# Patient Record
Sex: Female | Born: 1965
Health system: Southern US, Community
[De-identification: ages and names within clinical notes are randomized; demographics above are authoritative.]

---

## 2010-01-21 ENCOUNTER — Observation Stay (HOSPITAL_COMMUNITY): Admission: EM | Admit: 2010-01-21 | Discharge: 2010-01-22 | Payer: Self-pay | Admitting: Emergency Medicine

## 2010-04-08 ENCOUNTER — Emergency Department (HOSPITAL_COMMUNITY)
Admission: EM | Admit: 2010-04-08 | Discharge: 2010-04-09 | Payer: Self-pay | Source: Home / Self Care | Admitting: Emergency Medicine

## 2010-04-10 LAB — URINALYSIS, ROUTINE W REFLEX MICROSCOPIC
Bilirubin Urine: NEGATIVE
Ketones, ur: >80 mg/dL — AB
Leukocytes, UA: NEGATIVE
Nitrite: NEGATIVE
Protein, ur: NEGATIVE mg/dL
Specific Gravity, Urine: 1.038 — ABNORMAL HIGH (ref 1.005–1.030)
Urine Glucose, Fasting: >1000 mg/dL — AB
Urobilinogen, UA: 0.2 mg/dL (ref 0.0–1.0)
pH: 6.5 (ref 5.0–8.0)

## 2010-04-10 LAB — URINE MICROSCOPIC-ADD ON

## 2010-04-10 LAB — GLUCOSE, CAPILLARY: Glucose-Capillary: 227 mg/dL — ABNORMAL HIGH (ref 70–99)

## 2010-04-10 LAB — POCT PREGNANCY, URINE: Preg Test, Ur: NEGATIVE

## 2010-05-28 LAB — URINE MICROSCOPIC-ADD ON

## 2010-05-28 LAB — COMPREHENSIVE METABOLIC PANEL
ALT: 13 U/L (ref 0–35)
AST: 17 U/L (ref 0–37)
Albumin: 4.3 g/dL (ref 3.5–5.2)
Alkaline Phosphatase: 45 U/L (ref 39–117)
BUN: 5 mg/dL — ABNORMAL LOW (ref 6–23)
CO2: 24 mEq/L (ref 19–32)
Calcium: 9.5 mg/dL (ref 8.4–10.5)
Chloride: 105 mEq/L (ref 96–112)
Creatinine, Ser: 0.61 mg/dL (ref 0.4–1.2)
GFR calc Af Amer: >60 mL/min (ref >60)
GFR calc non Af Amer: >60 mL/min (ref >60)
Glucose, Bld: 232 mg/dL — ABNORMAL HIGH (ref 70–99)
Potassium: 3.1 mEq/L — ABNORMAL LOW (ref 3.5–5.1)
Sodium: 140 mEq/L (ref 135–145)
Total Bilirubin: 0.8 mg/dL (ref 0.3–1.2)
Total Protein: 8.3 g/dL (ref 6.0–8.3)

## 2010-05-28 LAB — URINE CULTURE
Colony Count: 10000
Culture  Setup Time: 201111072242

## 2010-05-28 LAB — CBC
HCT: 37.1 % (ref 36.0–46.0)
Hemoglobin: 12.6 g/dL (ref 12.0–15.0)
MCH: 32.5 pg (ref 26.0–34.0)
MCHC: 34 g/dL (ref 30.0–36.0)
MCV: 95.6 fL (ref 78.0–100.0)
Platelets: 209 10*3/uL (ref 150–400)
RBC: 3.88 MIL/uL (ref 3.87–5.11)
RDW: 13 % (ref 11.5–15.5)
WBC: 9.8 10*3/uL (ref 4.0–10.5)

## 2010-05-28 LAB — URINALYSIS, ROUTINE W REFLEX MICROSCOPIC
Glucose, UA: >1000 mg/dL — AB
Hgb urine dipstick: NEGATIVE
Ketones, ur: >80 mg/dL — AB
Leukocytes, UA: NEGATIVE
Nitrite: NEGATIVE
Protein, ur: 30 mg/dL — AB
Specific Gravity, Urine: 1.034 — ABNORMAL HIGH (ref 1.005–1.030)
Urobilinogen, UA: 0.2 mg/dL (ref 0.0–1.0)
pH: 6 (ref 5.0–8.0)

## 2010-05-28 LAB — DIFFERENTIAL
Basophils Absolute: 0 10*3/uL (ref 0.0–0.1)
Basophils Relative: 0 % (ref 0–1)
Eosinophils Absolute: 0 10*3/uL (ref 0.0–0.7)
Eosinophils Relative: 0 % (ref 0–5)
Lymphocytes Relative: 28 % (ref 12–46)
Lymphs Abs: 2.7 10*3/uL (ref 0.7–4.0)
Monocytes Absolute: 0.8 10*3/uL (ref 0.1–1.0)
Monocytes Relative: 8 % (ref 3–12)
Neutro Abs: 6.2 10*3/uL (ref 1.7–7.7)
Neutrophils Relative %: 64 % (ref 43–77)

## 2010-05-28 LAB — LIPASE, BLOOD: Lipase: 26 U/L (ref 11–59)

## 2010-05-28 LAB — PREGNANCY, URINE: Preg Test, Ur: NEGATIVE

## 2010-05-28 LAB — KETONES, QUALITATIVE: Acetone, Bld: NEGATIVE

## 2010-05-28 LAB — GLUCOSE, CAPILLARY: Glucose-Capillary: 220 mg/dL — ABNORMAL HIGH (ref 70–99)

## 2011-09-08 IMAGING — CT CT ABD-PELV W/ CM
2 of 5 series · 17 of 46 positions shown, 19 images · IV contrast (agent unspecified)
Comparison: None

CLINICAL DATA: Chills.  Vomiting.

CT ABDOMEN AND PELVIS WITH CONTRAST
TECHNIQUE: Multidetector CT imaging of the abdomen and pelvis was
performed following the standard protocol during bolus
administration of intravenous contrast.
Contrast: 100 ml Wmnipaque-5PP

[Series 2: routine abdomen · axial · 0.70mm/px · z∈[-498,-72]mm · 14 of 95 slices shown, 16 images]
[im 5/95  soft-tissue]
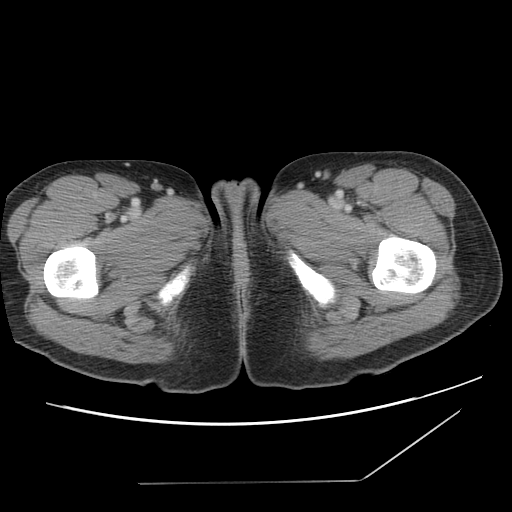
[im 5/95  bone]
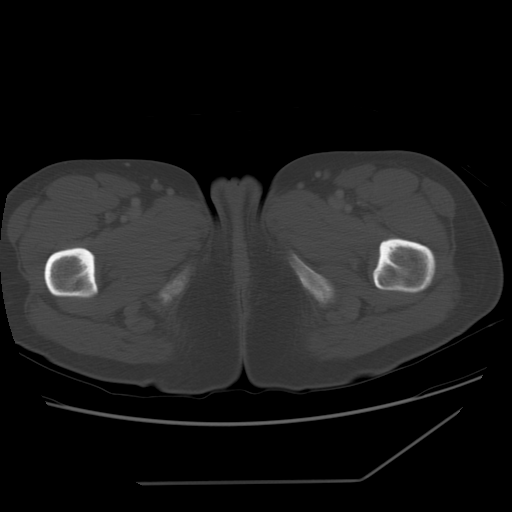
[im 10/95  soft-tissue]
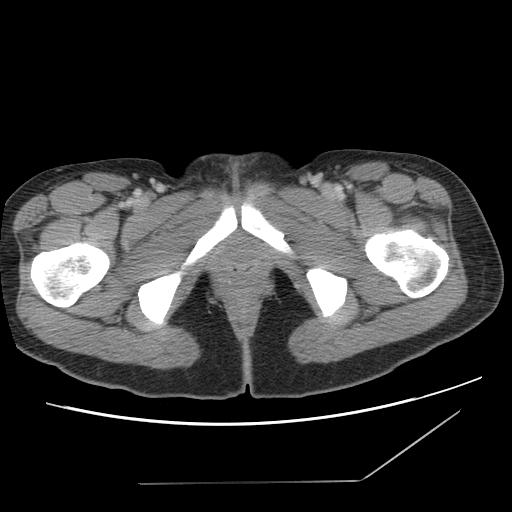
[im 20/95  soft-tissue]
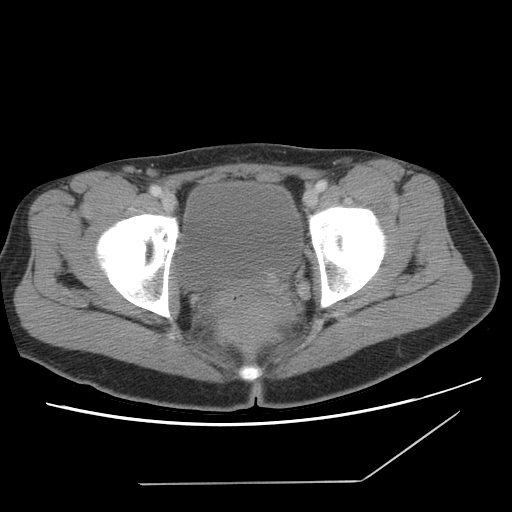
[im 25/95  soft-tissue]
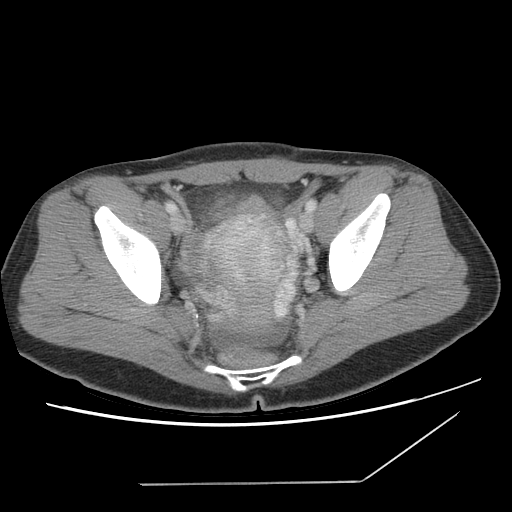
[im 30/95  soft-tissue]
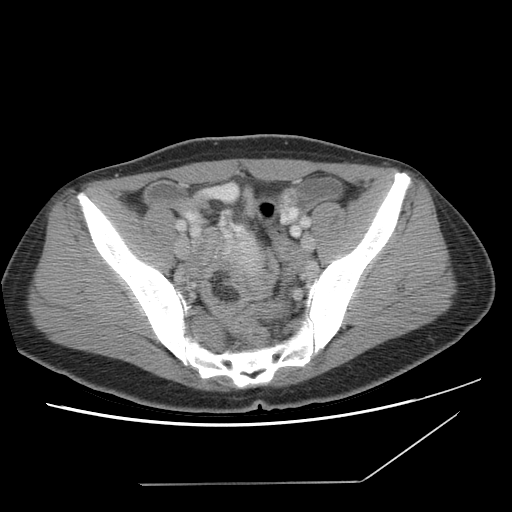
[im 40/95  soft-tissue]
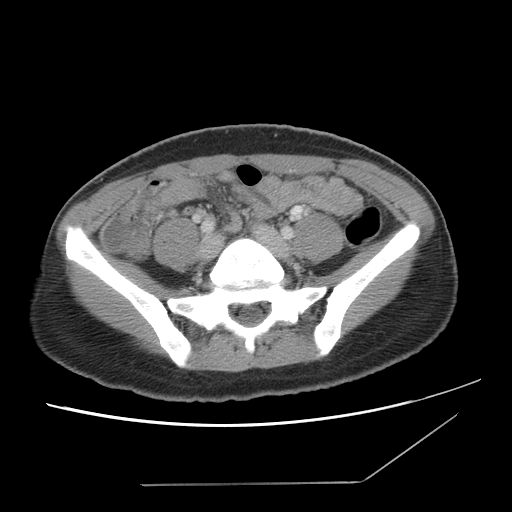
[im 45/95  soft-tissue]
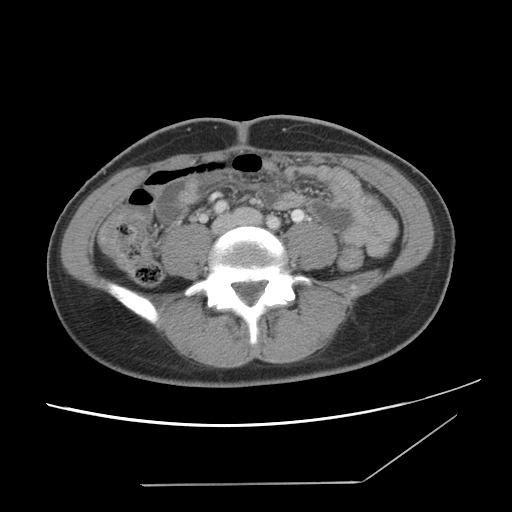
[im 50/95  soft-tissue]
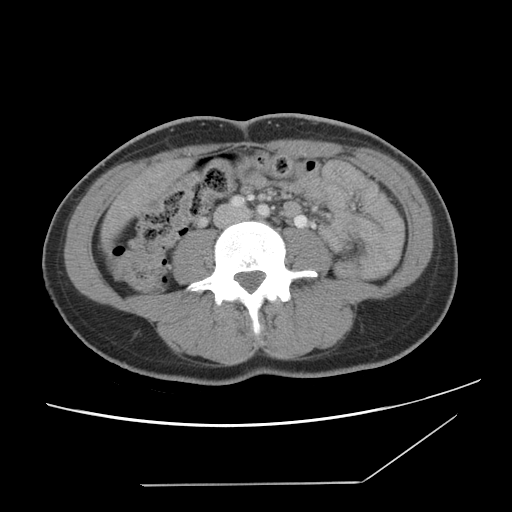
[im 55/95  soft-tissue]
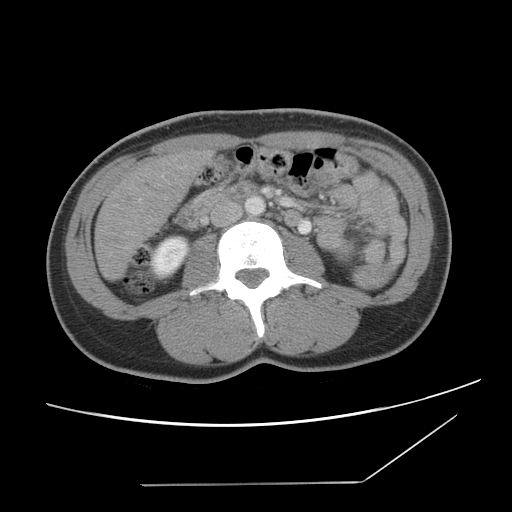
[im 55/95  bone]
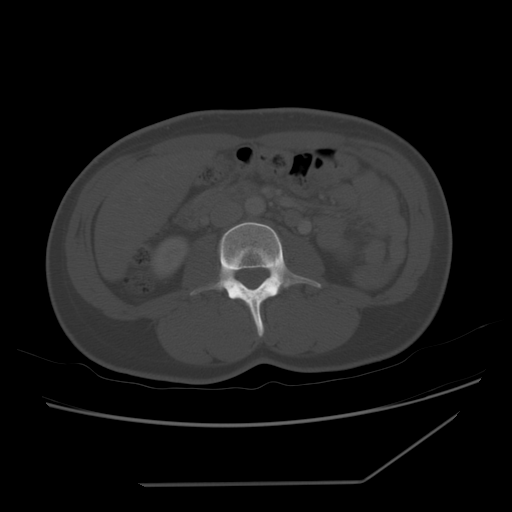
[im 65/95  soft-tissue]
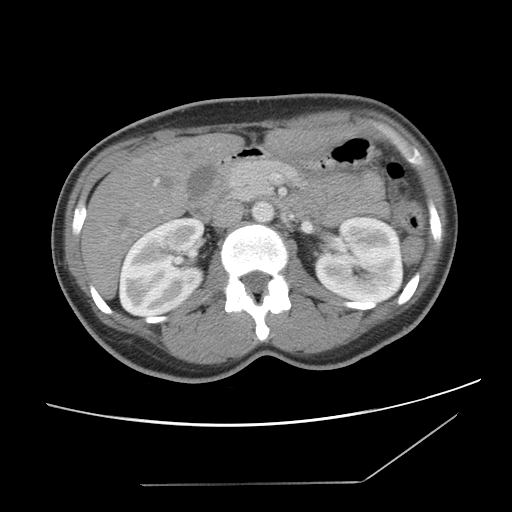
[im 70/95  soft-tissue]
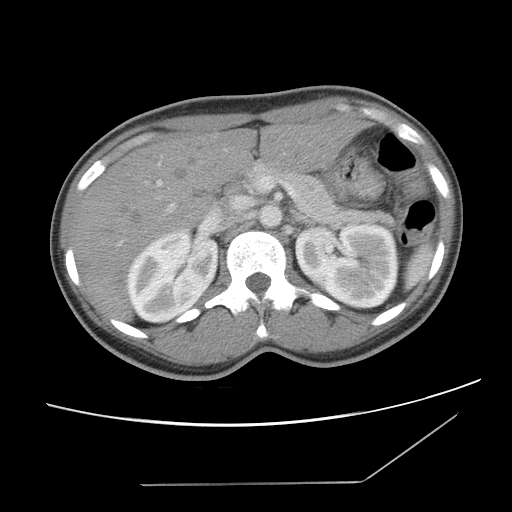
[im 75/95  soft-tissue]
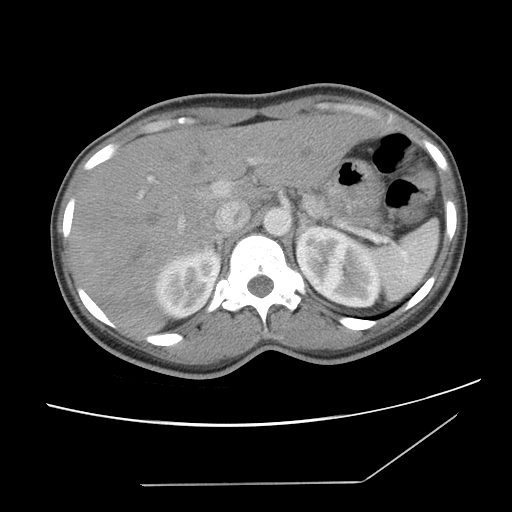
[im 85/95  soft-tissue]
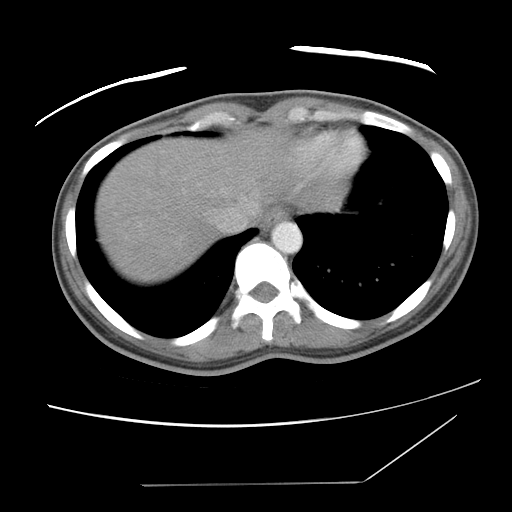
[im 90/95  soft-tissue]
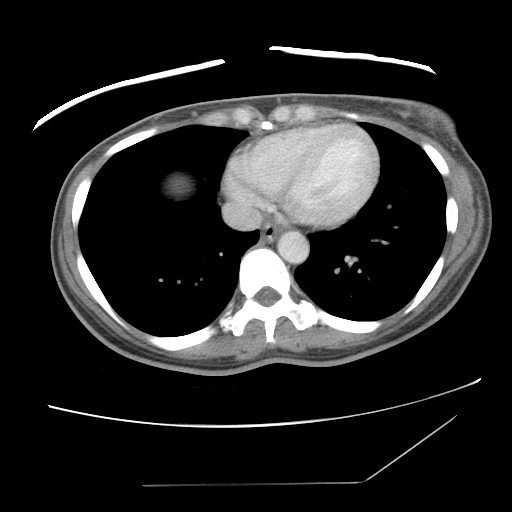

[Series 401: cor · coronal · 0.94mm/px · 3 of 101 slices shown]
[im 34/101  soft-tissue]
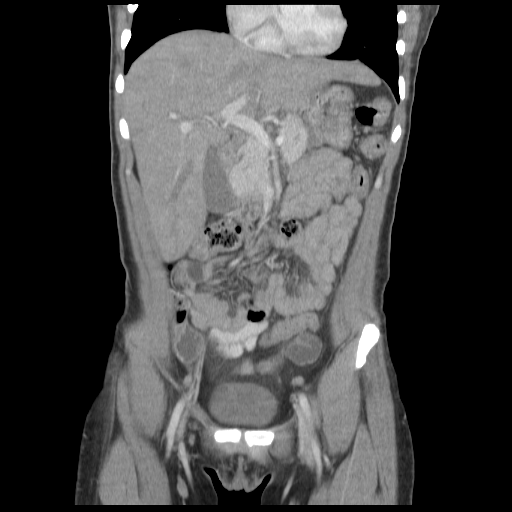
[im 45/101  soft-tissue]
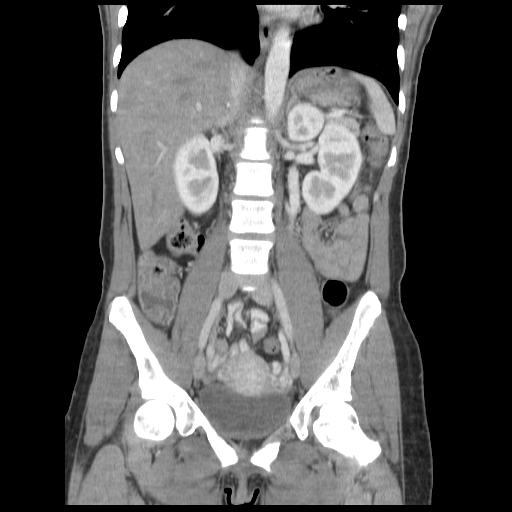
[im 56/101  soft-tissue]
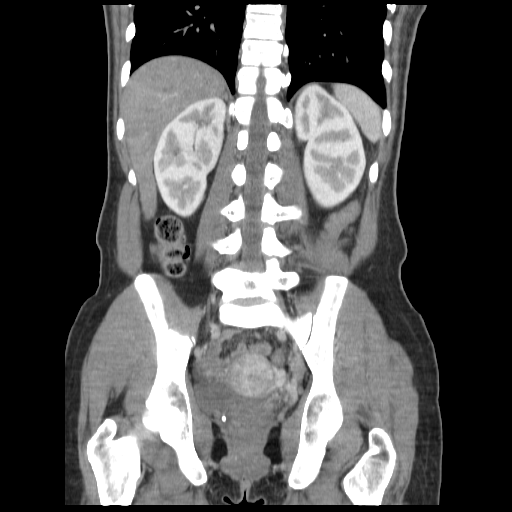

[17 of 46 positions shown; findings below may reference images not displayed]

FINDINGS: Liver, gallbladder, spleen, pancreas, adrenal glands,
kidneys are within normal limits.  Contrast is seen refluxing into
both ovarian veins which are dilated.  Bladder is within normal
limits.  Pelvic varices are seen on both sides of the uterus
extending from the ovarian veins.  Bowel is decompressed.  No focal
bowel wall thickening to suggest inflammatory change.  Small amount
of free fluid is seen layering in the pelvis.
IMPRESSION: Bilateral ovarian vein and venous insufficiency leading to pelvic
varices.

Small amount of free fluid layering in the pelvis which is
nonspecific.

## 2015-07-31 DIAGNOSIS — E1165 Type 2 diabetes mellitus with hyperglycemia: Secondary | ICD-10-CM | POA: Diagnosis not present

## 2015-07-31 DIAGNOSIS — E782 Mixed hyperlipidemia: Secondary | ICD-10-CM | POA: Diagnosis not present

## 2015-07-31 DIAGNOSIS — E119 Type 2 diabetes mellitus without complications: Secondary | ICD-10-CM | POA: Diagnosis not present

## 2015-07-31 DIAGNOSIS — F411 Generalized anxiety disorder: Secondary | ICD-10-CM | POA: Diagnosis not present

## 2016-02-28 DIAGNOSIS — E119 Type 2 diabetes mellitus without complications: Secondary | ICD-10-CM | POA: Diagnosis not present

## 2016-02-28 DIAGNOSIS — E782 Mixed hyperlipidemia: Secondary | ICD-10-CM | POA: Diagnosis not present

## 2016-05-29 DIAGNOSIS — E782 Mixed hyperlipidemia: Secondary | ICD-10-CM | POA: Diagnosis not present

## 2016-05-29 DIAGNOSIS — E119 Type 2 diabetes mellitus without complications: Secondary | ICD-10-CM | POA: Diagnosis not present

## 2016-09-01 DIAGNOSIS — E782 Mixed hyperlipidemia: Secondary | ICD-10-CM | POA: Diagnosis not present

## 2016-09-01 DIAGNOSIS — Z1231 Encounter for screening mammogram for malignant neoplasm of breast: Secondary | ICD-10-CM | POA: Diagnosis not present

## 2016-09-01 DIAGNOSIS — E119 Type 2 diabetes mellitus without complications: Secondary | ICD-10-CM | POA: Diagnosis not present

## 2016-09-22 DIAGNOSIS — E119 Type 2 diabetes mellitus without complications: Secondary | ICD-10-CM | POA: Diagnosis not present

## 2016-12-15 DIAGNOSIS — E119 Type 2 diabetes mellitus without complications: Secondary | ICD-10-CM | POA: Diagnosis not present

## 2016-12-15 DIAGNOSIS — E782 Mixed hyperlipidemia: Secondary | ICD-10-CM | POA: Diagnosis not present

## 2017-03-23 DIAGNOSIS — B351 Tinea unguium: Secondary | ICD-10-CM | POA: Diagnosis not present

## 2017-03-23 DIAGNOSIS — E782 Mixed hyperlipidemia: Secondary | ICD-10-CM | POA: Diagnosis not present

## 2017-03-23 DIAGNOSIS — E119 Type 2 diabetes mellitus without complications: Secondary | ICD-10-CM | POA: Diagnosis not present

## 2017-06-30 DIAGNOSIS — Z1231 Encounter for screening mammogram for malignant neoplasm of breast: Secondary | ICD-10-CM | POA: Diagnosis not present

## 2017-06-30 DIAGNOSIS — E119 Type 2 diabetes mellitus without complications: Secondary | ICD-10-CM | POA: Diagnosis not present

## 2017-06-30 DIAGNOSIS — E782 Mixed hyperlipidemia: Secondary | ICD-10-CM | POA: Diagnosis not present

## 2017-07-24 DIAGNOSIS — Z1231 Encounter for screening mammogram for malignant neoplasm of breast: Secondary | ICD-10-CM | POA: Diagnosis not present

## 2017-10-06 DIAGNOSIS — E119 Type 2 diabetes mellitus without complications: Secondary | ICD-10-CM | POA: Diagnosis not present

## 2017-10-06 DIAGNOSIS — E782 Mixed hyperlipidemia: Secondary | ICD-10-CM | POA: Diagnosis not present

## 2017-10-06 DIAGNOSIS — H1013 Acute atopic conjunctivitis, bilateral: Secondary | ICD-10-CM | POA: Diagnosis not present

## 2018-03-22 DIAGNOSIS — E119 Type 2 diabetes mellitus without complications: Secondary | ICD-10-CM | POA: Diagnosis not present

## 2018-03-22 DIAGNOSIS — E1169 Type 2 diabetes mellitus with other specified complication: Secondary | ICD-10-CM | POA: Diagnosis not present

## 2018-03-22 DIAGNOSIS — F17210 Nicotine dependence, cigarettes, uncomplicated: Secondary | ICD-10-CM | POA: Diagnosis not present

## 2018-03-22 DIAGNOSIS — E782 Mixed hyperlipidemia: Secondary | ICD-10-CM | POA: Diagnosis not present

## 2018-06-22 DIAGNOSIS — E1169 Type 2 diabetes mellitus with other specified complication: Secondary | ICD-10-CM | POA: Diagnosis not present

## 2018-06-22 DIAGNOSIS — E782 Mixed hyperlipidemia: Secondary | ICD-10-CM | POA: Diagnosis not present

## 2018-06-22 DIAGNOSIS — Z1231 Encounter for screening mammogram for malignant neoplasm of breast: Secondary | ICD-10-CM | POA: Diagnosis not present

## 2018-11-10 DIAGNOSIS — Z1211 Encounter for screening for malignant neoplasm of colon: Secondary | ICD-10-CM | POA: Diagnosis not present

## 2018-11-10 DIAGNOSIS — Z1231 Encounter for screening mammogram for malignant neoplasm of breast: Secondary | ICD-10-CM | POA: Diagnosis not present

## 2018-11-10 DIAGNOSIS — E1169 Type 2 diabetes mellitus with other specified complication: Secondary | ICD-10-CM | POA: Diagnosis not present

## 2018-11-10 DIAGNOSIS — E782 Mixed hyperlipidemia: Secondary | ICD-10-CM | POA: Diagnosis not present

## 2019-07-28 ENCOUNTER — Other Ambulatory Visit: Payer: Self-pay

## 2019-11-16 ENCOUNTER — Other Ambulatory Visit: Payer: Self-pay

## 2019-11-16 ENCOUNTER — Encounter: Payer: Self-pay | Admitting: Physician Assistant

## 2019-11-16 ENCOUNTER — Ambulatory Visit: Payer: BC Managed Care – PPO | Admitting: Physician Assistant

## 2019-11-16 VITALS — BP 118/70 | HR 70 | Temp 97.6°F | Resp 17 | Ht 67.0 in | Wt 146.6 lb

## 2019-11-16 DIAGNOSIS — E119 Type 2 diabetes mellitus without complications: Secondary | ICD-10-CM | POA: Diagnosis not present

## 2019-11-16 DIAGNOSIS — Z23 Encounter for immunization: Secondary | ICD-10-CM

## 2019-11-16 DIAGNOSIS — Z1211 Encounter for screening for malignant neoplasm of colon: Secondary | ICD-10-CM | POA: Diagnosis not present

## 2019-11-16 DIAGNOSIS — E782 Mixed hyperlipidemia: Secondary | ICD-10-CM | POA: Insufficient documentation

## 2019-11-16 HISTORY — DX: Mixed hyperlipidemia: E78.2

## 2019-11-16 HISTORY — DX: Type 2 diabetes mellitus without complications: E11.9

## 2019-11-16 LAB — POCT URINALYSIS DIPSTICK
Appearance: NORMAL
Bilirubin, UA: NEGATIVE
Glucose, UA: POSITIVE — AB
Leukocytes, UA: NEGATIVE
Nitrite, UA: NEGATIVE
Odor: NORMAL
Protein, UA: POSITIVE — AB
Spec Grav, UA: 1.015 (ref 1.010–1.025)
Urobilinogen, UA: 0.2 E.U./dL
pH, UA: 5 (ref 5.0–8.0)

## 2019-11-16 LAB — GLUCOSE, POCT (MANUAL RESULT ENTRY): POC Glucose: 401 mg/dl — AB (ref 70–99)

## 2019-11-16 LAB — POCT UA - MICROALBUMIN: Microalbumin Ur, POC: 80 mg/L

## 2019-11-16 MED ORDER — LISINOPRIL 2.5 MG PO TABS: 2.5000 mg | ORAL_TABLET | Freq: Every day | ORAL | 2 refills | Status: DC

## 2019-11-16 MED ORDER — ROSUVASTATIN CALCIUM 5 MG PO TABS: 5.0000 mg | ORAL_TABLET | Freq: Every day | ORAL | 2 refills | Status: DC

## 2019-11-16 MED ORDER — JANUMET 50-500 MG PO TABS: 1.0000 | ORAL_TABLET | Freq: Two times a day (BID) | ORAL | 2 refills | Status: DC

## 2019-11-16 NOTE — Progress Notes (Signed)
Established Patient Office Visit  Subjective:  Patient ID: Katie White, female    DOB: [birth year]  Age: 54 y.o. MRN: [id number]  CC:  Chief Complaint  Patient presents with  . Diabetes       HPI Katie White presents for follow up of diabetes --- pt is a known diabetic however over 8 months ago she stopped all of her medications and states she started trying to control her diabetes with diet only - she states that for awhile her daily fasting sugars were in the 120s but she has not been checking her glucose in the past several months She is having fatigue, thirst, and polyuria  Past Medical History:  Diagnosis Date  . Mixed hyperlipidemia 11/16/2019  . Type 2 diabetes mellitus without complications (HCC) 11/16/2019    Past Surgical History:  Procedure Laterality Date  . CESAREAN SECTION  09/1984    Family History  Problem Relation Age of Onset  . Other Mother        covid 105  . Kidney failure Mother   . Stroke Mother   . Diabetes Mellitus II Mother   . Heart attack Brother   . Diabetes Mellitus II Brother     Social History   Socioeconomic History  . Marital status: Single    Spouse name: Not on file  . Number of children: 1  . Years of education: Not on file  . Highest education level: Not on file  Occupational History  . Occupation: Secondary school teacher  Tobacco Use  . Smoking status: Current Every Day Smoker    Packs/day: 0.50    Types: Cigarettes    Start date: 2020  . Smokeless tobacco: Never Used  Vaping Use  . Vaping Use: Never used  Substance and Sexual Activity  . Alcohol use: Yes    Alcohol/week: 1.0 standard drink    Types: 1 Glasses of wine per week    Comment: rarely  . Drug use: Never  . Sexual activity: Not Currently  Other Topics Concern  . Not on file  Social History Narrative  . Not on file   Social Determinants of Health   Financial Resource Strain:   . Difficulty of Paying Living Expenses: Not on file  Food Insecurity:   .  Worried About Programme researcher, broadcasting/film/video in the Last Year: Not on file  . Ran Out of Food in the Last Year: Not on file  Transportation Needs:   . Lack of Transportation (Medical): Not on file  . Lack of Transportation (Non-Medical): Not on file  Physical Activity:   . Days of Exercise per Week: Not on file  . Minutes of Exercise per Session: Not on file  Stress:   . Feeling of Stress : Not on file  Social Connections:   . Frequency of Communication with Friends and Family: Not on file  . Frequency of Social Gatherings with Friends and Family: Not on file  . Attends Religious Services: Not on file  . Active Member of Clubs or Organizations: Not on file  . Attends Banker Meetings: Not on file  . Marital Status: Not on file  Intimate Partner Violence:   . Fear of Current or Ex-Partner: Not on file  . Emotionally Abused: Not on file  . Physically Abused: Not on file  . Sexually Abused: Not on file     Current Outpatient Medications:  .  aspirin EC 81 MG tablet, Take 81 mg by mouth daily. Swallow whole., Disp: ,  Rfl:  .  Multiple Vitamin (MULTIVITAMIN) tablet, Take 1 tablet by mouth daily., Disp: , Rfl:  .  vitamin B-12 (CYANOCOBALAMIN) 100 MCG tablet, Take 100 mcg by mouth daily., Disp: , Rfl:  .  Vitamin D, Cholecalciferol, 10 MCG (400 UNIT) CHEW, Chew by mouth., Disp: , Rfl:  .  lisinopril (ZESTRIL) 2.5 MG tablet, Take 1 tablet (2.5 mg total) by mouth daily., Disp: 30 tablet, Rfl: 2 .  rosuvastatin (CRESTOR) 5 MG tablet, Take 1 tablet (5 mg total) by mouth daily., Disp: 90 tablet, Rfl: 2 .  sitaGLIPtin-metformin (JANUMET) 50-500 MG tablet, Take 1 tablet by mouth 2 (two) times daily with a meal., Disp: 60 tablet, Rfl: 2   No Known Allergies  ROS CONSTITUTIONAL: see HPI E/N/T: Negative for ear pain, nasal congestion and sore throat.  CARDIOVASCULAR: Negative for chest pain, dizziness, palpitations and pedal edema.  RESPIRATORY: Negative for recent cough and dyspnea.   GASTROINTESTINAL: Negative for abdominal pain, acid reflux symptoms, constipation, diarrhea, nausea and vomiting.  PSYCHIATRIC: Negative for sleep disturbance and to question depression screen.  Negative for depression, negative for anhedonia.        Objective:    PHYSICAL EXAM:   VS: BP 118/70 (BP Location: Left Arm, Patient Position: Sitting)   Pulse 70   Temp 97.6 F (36.4 C)   Resp 17   Ht 5\' 7"  (1.702 m)   Wt 146 lb 9.6 oz (66.5 kg)   SpO2 99%   BMI 22.96 kg/m   GEN: Well nourished, well developed, in no acute distress  Cardiac: RRR; no murmurs, rubs, or gallops,no edema -  Respiratory:  normal respiratory rate and pattern with no distress - normal breath sounds with no rales, rhonchi, wheezes or rubs Skin: warm and dry, no rash  Neuro:  Alert and Oriented x 3, Strength and sensation are intact - CN II-Xii grossly intact Psych: euthymic mood, appropriate affect and demeanor  BP 118/70 (BP Location: Left Arm, Patient Position: Sitting)   Pulse 70   Temp 97.6 F (36.4 C)   Resp 17   Ht 5\' 7"  (1.702 m)   Wt 146 lb 9.6 oz (66.5 kg)   SpO2 99%   BMI 22.96 kg/m  Wt Readings from Last 3 Encounters:  11/16/19 146 lb 9.6 oz (66.5 kg)   Office Visit on 11/16/2019  Component Date Value Ref Range Status  . Microalbumin Ur, POC 11/16/2019 80  mg/L Final  . Color, UA 11/16/2019 Yellow   Final  . Clarity, UA 11/16/2019 Clear   Final  . Glucose, UA 11/16/2019 Positive* Negative Final  . Bilirubin, UA 11/16/2019 Negative   Final  . Ketones, UA 11/16/2019 Small   Final  . Spec Grav, UA 11/16/2019 1.015  1.010 - 1.025 Final  . Blood, UA 11/16/2019 trace   Final  . pH, UA 11/16/2019 5.0  5.0 - 8.0 Final  . Protein, UA 11/16/2019 Positive* Negative Final  . Urobilinogen, UA 11/16/2019 0.2  0.2 or 1.0 E.U./dL Final  . Nitrite, UA 01/16/2020 negative   Final  . Leukocytes, UA 11/16/2019 Negative  Negative Final  . Appearance 11/16/2019 normal   Final  . Odor 11/16/2019 normal    Final  . POC Glucose 11/16/2019 401* 70 - 99 mg/dl Final   non fasting     Health Maintenance Due  Topic Date Due  . HEMOGLOBIN A1C  Never done  . FOOT EXAM  Never done    There are no preventive care reminders to display  for this patient.  No results found for: TSH Lab Results  Component Value Date   WBC 9.8 01/21/2010   HGB 12.6 01/21/2010   HCT 37.1 01/21/2010   MCV 95.6 01/21/2010   PLT 209 01/21/2010   Lab Results  Component Value Date   NA 140 01/21/2010   K 3.1 (L) 01/21/2010   CO2 24 01/21/2010   GLUCOSE 232 (H) 01/21/2010   BUN 5 (L) 01/21/2010   CREATININE 0.61 01/21/2010   BILITOT 0.8 01/21/2010   ALKPHOS 45 01/21/2010   AST 17 01/21/2010   ALT 13 01/21/2010   PROT 8.3 01/21/2010   ALBUMIN 4.3 01/21/2010   CALCIUM 9.5 01/21/2010   No results found for: CHOL No results found for: HDL No results found for: LDLCALC No results found for: TRIG No results found for: CHOLHDL No results found for: YQIH4V    Assessment & Plan:   Problem List Items Addressed This Visit      Endocrine   Type 2 diabetes mellitus without complications (HCC) - Primary    labwork pending Recommend to watch diet rx for Janumet 50/500 1 po bid Start lisinopril 2.5mg  qd Start rosuvastatin 5mg  qd Follow up in 3 months      Relevant Medications   aspirin EC 81 MG tablet   sitaGLIPtin-metformin (JANUMET) 50-500 MG tablet   lisinopril (ZESTRIL) 2.5 MG tablet   rosuvastatin (CRESTOR) 5 MG tablet   Other Relevant Orders   CBC with Differential/Platelet   Comprehensive metabolic panel   TSH   Lipid panel   Hemoglobin A1c   POCT UA - Microalbumin (Completed)   POCT urinalysis dipstick (Completed)   Glucose (CBG) (Completed)    Other Visit Diagnoses    Need for prophylactic vaccination and inoculation against influenza       Relevant Orders   Flu Vaccine MDCK QUAD PF (Completed)   Colon cancer screening       Relevant Orders   Ambulatory referral to Gastroenterology       Meds ordered this encounter  Medications  . sitaGLIPtin-metformin (JANUMET) 50-500 MG tablet    Sig: Take 1 tablet by mouth 2 (two) times daily with a meal.    Dispense:  60 tablet    Refill:  2    Order Specific Question:   Supervising Provider    Answer  . lisinopril (ZESTRIL) 2.5 MG tablet    Sig: Take 1 tablet (2.5 mg total) by mouth daily.    Dispense:  30 tablet    Refill:  2    Order Specific Question:   Supervising Provider    AnswerCorey Harold Blane Ohara  . rosuvastatin (CRESTOR) 5 MG tablet    Sig: Take 1 tablet (5 mg total) by mouth daily.    Dispense:  90 tablet    Refill:  2    Order Specific Question:   Supervising Provider    Answer:   [id number]    Follow-up: Return in about 3 months (around 02/15/2020) for chronic fasting follow up.    SARA R Yochanan Eddleman, PA-C

## 2019-11-16 NOTE — Assessment & Plan Note (Addendum)
labwork pending Recommend to watch diet rx for Janumet 50/500 1 po bid Start lisinopril 2.5mg  qd Start rosuvastatin 5mg  qd Follow up in 3 months

## 2019-11-17 LAB — CBC WITH DIFFERENTIAL/PLATELET
Basophils Absolute: 0 10*3/uL (ref 0.0–0.2)
Basos: 0 %
EOS (ABSOLUTE): 0.1 10*3/uL (ref 0.0–0.4)
Eos: 1 %
Hematocrit: 40.5 % (ref 34.0–46.6)
Hemoglobin: 13.2 g/dL (ref 11.1–15.9)
Immature Grans (Abs): 0 10*3/uL (ref 0.0–0.1)
Immature Granulocytes: 0 %
Lymphocytes Absolute: 3.9 10*3/uL — ABNORMAL HIGH (ref 0.7–3.1)
Lymphs: 41 %
MCH: 32.6 pg (ref 26.6–33.0)
MCHC: 32.6 g/dL (ref 31.5–35.7)
MCV: 100 fL — ABNORMAL HIGH (ref 79–97)
Monocytes Absolute: 0.6 10*3/uL (ref 0.1–0.9)
Monocytes: 7 %
Neutrophils Absolute: 4.8 10*3/uL (ref 1.4–7.0)
Neutrophils: 51 %
Platelets: 220 10*3/uL (ref 150–450)
RBC: 4.05 x10E6/uL (ref 3.77–5.28)
RDW: 12.1 % (ref 11.7–15.4)
WBC: 9.4 10*3/uL (ref 3.4–10.8)

## 2019-11-17 LAB — COMPREHENSIVE METABOLIC PANEL
ALT: 15 IU/L (ref 0–32)
AST: 21 IU/L (ref 0–40)
Albumin/Globulin Ratio: 1.3 (ref 1.2–2.2)
Albumin: 4.4 g/dL (ref 3.8–4.9)
Alkaline Phosphatase: 76 IU/L (ref 48–121)
BUN/Creatinine Ratio: 16 (ref 9–23)
BUN: 12 mg/dL (ref 6–24)
Bilirubin Total: 0.4 mg/dL (ref 0.0–1.2)
CO2: 26 mmol/L (ref 20–29)
Calcium: 9.6 mg/dL (ref 8.7–10.2)
Chloride: 94 mmol/L — ABNORMAL LOW (ref 96–106)
Creatinine, Ser: 0.73 mg/dL (ref 0.57–1.00)
GFR calc Af Amer: 110 mL/min/{1.73_m2} (ref >59)
GFR calc non Af Amer: 95 mL/min/{1.73_m2} (ref >59)
Globulin, Total: 3.4 g/dL (ref 1.5–4.5)
Glucose: 368 mg/dL — ABNORMAL HIGH (ref 65–99)
Potassium: 4 mmol/L (ref 3.5–5.2)
Sodium: 135 mmol/L (ref 134–144)
Total Protein: 7.8 g/dL (ref 6.0–8.5)

## 2019-11-17 LAB — LIPID PANEL
Chol/HDL Ratio: 4 ratio (ref 0.0–4.4)
Cholesterol, Total: 230 mg/dL — ABNORMAL HIGH (ref 100–199)
HDL: 57 mg/dL (ref >39)
LDL Chol Calc (NIH): 153 mg/dL — ABNORMAL HIGH (ref 0–99)
Triglycerides: 112 mg/dL (ref 0–149)
VLDL Cholesterol Cal: 20 mg/dL (ref 5–40)

## 2019-11-17 LAB — CARDIOVASCULAR RISK ASSESSMENT

## 2019-11-17 LAB — TSH: TSH: 1.1 u[IU]/mL (ref 0.450–4.500)

## 2019-11-17 LAB — HEMOGLOBIN A1C
Est. average glucose Bld gHb Est-mCnc: 369 mg/dL
Hgb A1c MFr Bld: 14.5 % — ABNORMAL HIGH (ref 4.8–5.6)

## 2019-12-09 ENCOUNTER — Ambulatory Visit: Payer: Self-pay

## 2019-12-12 ENCOUNTER — Encounter: Payer: Self-pay | Admitting: Physician Assistant

## 2019-12-12 DIAGNOSIS — Z1212 Encounter for screening for malignant neoplasm of rectum: Secondary | ICD-10-CM | POA: Diagnosis not present

## 2019-12-12 DIAGNOSIS — Z1211 Encounter for screening for malignant neoplasm of colon: Secondary | ICD-10-CM | POA: Diagnosis not present

## 2019-12-12 LAB — COLOGUARD: Cologuard: NEGATIVE

## 2019-12-14 LAB — COLOGUARD: COLOGUARD: NEGATIVE

## 2020-01-07 ENCOUNTER — Other Ambulatory Visit: Payer: Self-pay | Admitting: Physician Assistant

## 2020-01-07 DIAGNOSIS — E119 Type 2 diabetes mellitus without complications: Secondary | ICD-10-CM

## 2020-02-06 ENCOUNTER — Other Ambulatory Visit: Payer: Self-pay | Admitting: Physician Assistant

## 2020-02-06 DIAGNOSIS — E119 Type 2 diabetes mellitus without complications: Secondary | ICD-10-CM

## 2020-02-22 ENCOUNTER — Ambulatory Visit (INDEPENDENT_AMBULATORY_CARE_PROVIDER_SITE_OTHER): Payer: BC Managed Care – PPO | Admitting: Physician Assistant

## 2020-02-22 ENCOUNTER — Other Ambulatory Visit: Payer: Self-pay

## 2020-02-22 ENCOUNTER — Encounter: Payer: Self-pay | Admitting: Physician Assistant

## 2020-02-22 VITALS — BP 120/72 | HR 72 | Temp 97.4°F | Ht 67.0 in | Wt 149.0 lb

## 2020-02-22 DIAGNOSIS — E119 Type 2 diabetes mellitus without complications: Secondary | ICD-10-CM | POA: Diagnosis not present

## 2020-02-22 DIAGNOSIS — E782 Mixed hyperlipidemia: Secondary | ICD-10-CM | POA: Diagnosis not present

## 2020-02-22 NOTE — Progress Notes (Signed)
Subjective:  Patient ID: Katie White, female    DOB: [birth year]  Age: 54 y.o. MRN: [id number]  Chief Complaint  Patient presents with  . Diabetes    7M follow up    HPI  pt with history of diabetes - before last visit she had stopped all of her medications for almost a year - but decided she was going to take better care of herself and now has been on medication again for past 3 months, seen dietician and watching diet Current meds include janumet 50/500 and zestril 2.5mg   Her glucose is still ranging in the 200s  Mixed hyperlipidemia  Pt presents with hyperlipidemia.  The patient is compliant with medications, maintains a low cholesterol diet , follows up as directed , and maintains an exercise regimen . The patient denies experiencing any hypercholesterolemia related symptoms. She is currently on crestor 5mg  qd   Current Outpatient Medications on File Prior to Visit  Medication Sig Dispense Refill  . aspirin EC 81 MG tablet Take 81 mg by mouth daily. Swallow whole.    JANUMET 50-500 MG tablet TAKE 1 TABLET BY MOUTH TWICE A DAY WITH A MEAL 60 tablet 2  . lisinopril (ZESTRIL) 2.5 MG tablet TAKE 1 TABLET BY MOUTH EVERY DAY 30 tablet 2  . Multiple Vitamin (MULTIVITAMIN) tablet Take 1 tablet by mouth daily.    . rosuvastatin (CRESTOR) 5 MG tablet Take 1 tablet (5 mg total) by mouth daily. 90 tablet 2  . vitamin B-12 (CYANOCOBALAMIN) 100 MCG tablet Take 100 mcg by mouth daily.    . Vitamin D, Cholecalciferol, 10 MCG (400 UNIT) CHEW Chew by mouth.     No current facility-administered medications on file prior to visit.   Past Medical History:  Diagnosis Date  . Mixed hyperlipidemia 11/16/2019  . Type 2 diabetes mellitus without complications (HCC) 11/16/2019   Past Surgical History:  Procedure Laterality Date  . CESAREAN SECTION  09/1984    Family History  Problem Relation Age of Onset  . Other Mother        covid 50  . Kidney failure Mother   . Stroke Mother   . Diabetes  Mellitus II Mother   . Heart attack Brother   . Diabetes Mellitus II Brother    Social History   Socioeconomic History  . Marital status: Single    Spouse name: Not on file  . Number of children: 1  . Years of education: Not on file  . Highest education level: Not on file  Occupational History  . Occupation: 12  Tobacco Use  . Smoking status: Current Every Day Smoker    Packs/day: 0.50    Types: Cigarettes    Start date: 2020  . Smokeless tobacco: Never Used  Vaping Use  . Vaping Use: Never used  Substance and Sexual Activity  . Alcohol use: Yes    Alcohol/week: 1.0 standard drink    Types: 1 Glasses of wine per week    Comment: rarely  . Drug use: Never  . Sexual activity: Not Currently  Other Topics Concern  . Not on file  Social History Narrative  . Not on file   Social Determinants of Health   Financial Resource Strain:   . Difficulty of Paying Living Expenses: Not on file  Food Insecurity:   . Worried About 2021 in the Last Year: Not on file  . Ran Out of Food in the Last Year: Not on file  Transportation Needs:   .  Lack of Transportation (Medical): Not on file  . Lack of Transportation (Non-Medical): Not on file  Physical Activity:   . Days of Exercise per Week: Not on file  . Minutes of Exercise per Session: Not on file  Stress:   . Feeling of Stress : Not on file  Social Connections:   . Frequency of Communication with Friends and Family: Not on file  . Frequency of Social Gatherings with Friends and Family: Not on file  . Attends Religious Services: Not on file  . Active Member of Clubs or Organizations: Not on file  . Attends Banker Meetings: Not on file  . Marital Status: Not on file    Review of Systems  CONSTITUTIONAL: Negative for chills, fatigue, fever, unintentional weight gain and unintentional weight loss.   CARDIOVASCULAR: Negative for chest pain, dizziness, palpitations and pedal edema.   RESPIRATORY: Negative for recent cough and dyspnea.  GASTROINTESTINAL: Negative for abdominal pain, acid reflux symptoms, constipation, diarrhea, nausea and vomiting.   PSYCHIATRIC: Negative for sleep disturbance and to question depression screen.  Negative for depression, negative for anhedonia.      Objective:  BP 120/72 (BP Location: Right Arm, Patient Position: Sitting, Cuff Size: Normal)   Pulse 72   Temp (!) 97.4 F (36.3 C) (Temporal)   Ht 5\' 7"  (1.702 m)   Wt 149 lb (67.6 kg)   SpO2 97%   BMI 23.34 kg/m   BP/Weight 02/22/2020 11/16/2019  Systolic BP 120 118  Diastolic BP 72 70  Wt. (Lbs) 149 146.6  BMI 23.34 22.96    Physical Exam PHYSICAL EXAM:   VS: BP 120/72 (BP Location: Right Arm, Patient Position: Sitting, Cuff Size: Normal)   Pulse 72   Temp (!) 97.4 F (36.3 C) (Temporal)   Ht 5\' 7"  (1.702 m)   Wt 149 lb (67.6 kg)   SpO2 97%   BMI 23.34 kg/m   GEN: Well nourished, well developed, in no acute distress   Cardiac: RRR; no murmurs, rubs, or gallops,no edema -  Respiratory:  normal respiratory rate and pattern with no distress - normal breath sounds with no rales, rhonchi, wheezes or rubs GI: normal bowel sounds, no masses or tenderness  Psych: euthymic mood, appropriate affect and demeanor  Diabetic Foot Exam - Simple   Simple Foot Form Visual Inspection No deformities, no ulcerations, no other skin breakdown bilaterally: Yes Sensation Testing Intact to touch and monofilament testing bilaterally: Yes Pulse Check Posterior Tibialis and Dorsalis pulse intact bilaterally: Yes Comments      Lab Results  Component Value Date   WBC 9.4 11/16/2019   HGB 13.2 11/16/2019   HCT 40.5 11/16/2019   PLT 220 11/16/2019   GLUCOSE 368 (H) 11/16/2019   CHOL 230 (H) 11/16/2019   TRIG 112 11/16/2019   HDL 57 11/16/2019   LDLCALC 153 (H) 11/16/2019   ALT 15 11/16/2019   AST 21 11/16/2019   NA 135 11/16/2019   K 4.0 11/16/2019   CL 94 (L) 11/16/2019    CREATININE 0.73 11/16/2019   BUN 12 11/16/2019   CO2 26 11/16/2019   TSH 1.100 11/16/2019   HGBA1C 14.5 (H) 11/16/2019   MICROALBUR 80 11/16/2019      Assessment & Plan:   1. Type 2 diabetes mellitus without complication, without long-term current use of insulin (HCC) - CBC with Differential/Platelet - Comprehensive metabolic panel - Hemoglobin A1c Continue current meds and will adjust according to labwork results 2. Mixed hyperlipidemia - Lipid panel  continue current meds as directed  No orders of the defined types were placed in this encounter.   Orders Placed This Encounter  Procedures  . CBC with Differential/Platelet  . Comprehensive metabolic panel  . Lipid panel  . Hemoglobin A1c        Follow-up: Return in about 3 months (around 05/22/2020) for chronic fasting follow up.  An After Visit Summary was printed and given to the patient.  Jettie Pagan [facility] [phone]

## 2020-02-23 ENCOUNTER — Other Ambulatory Visit: Payer: Self-pay | Admitting: Physician Assistant

## 2020-02-23 DIAGNOSIS — E119 Type 2 diabetes mellitus without complications: Secondary | ICD-10-CM

## 2020-02-23 LAB — COMPREHENSIVE METABOLIC PANEL
ALT: 16 IU/L (ref 0–32)
AST: 14 IU/L (ref 0–40)
Albumin/Globulin Ratio: 1.4 (ref 1.2–2.2)
Albumin: 4.4 g/dL (ref 3.8–4.9)
Alkaline Phosphatase: 59 IU/L (ref 44–121)
BUN/Creatinine Ratio: 14 (ref 9–23)
BUN: 8 mg/dL (ref 6–24)
Bilirubin Total: 0.4 mg/dL (ref 0.0–1.2)
CO2: 25 mmol/L (ref 20–29)
Calcium: 9.7 mg/dL (ref 8.7–10.2)
Chloride: 99 mmol/L (ref 96–106)
Creatinine, Ser: 0.58 mg/dL (ref 0.57–1.00)
GFR calc Af Amer: 123 mL/min/{1.73_m2} (ref >59)
GFR calc non Af Amer: 106 mL/min/{1.73_m2} (ref >59)
Globulin, Total: 3.2 g/dL (ref 1.5–4.5)
Glucose: 238 mg/dL — ABNORMAL HIGH (ref 65–99)
Potassium: 4.3 mmol/L (ref 3.5–5.2)
Sodium: 137 mmol/L (ref 134–144)
Total Protein: 7.6 g/dL (ref 6.0–8.5)

## 2020-02-23 LAB — CBC WITH DIFFERENTIAL/PLATELET
Basophils Absolute: 0 10*3/uL (ref 0.0–0.2)
Basos: 0 %
EOS (ABSOLUTE): 0.1 10*3/uL (ref 0.0–0.4)
Eos: 1 %
Hematocrit: 37.6 % (ref 34.0–46.6)
Hemoglobin: 12.3 g/dL (ref 11.1–15.9)
Immature Grans (Abs): 0 10*3/uL (ref 0.0–0.1)
Immature Granulocytes: 0 %
Lymphocytes Absolute: 3.5 10*3/uL — ABNORMAL HIGH (ref 0.7–3.1)
Lymphs: 39 %
MCH: 32.9 pg (ref 26.6–33.0)
MCHC: 32.7 g/dL (ref 31.5–35.7)
MCV: 101 fL — ABNORMAL HIGH (ref 79–97)
Monocytes Absolute: 0.7 10*3/uL (ref 0.1–0.9)
Monocytes: 7 %
Neutrophils Absolute: 4.8 10*3/uL (ref 1.4–7.0)
Neutrophils: 53 %
Platelets: 258 10*3/uL (ref 150–450)
RBC: 3.74 x10E6/uL — ABNORMAL LOW (ref 3.77–5.28)
RDW: 12.6 % (ref 11.7–15.4)
WBC: 9.1 10*3/uL (ref 3.4–10.8)

## 2020-02-23 LAB — HEMOGLOBIN A1C
Est. average glucose Bld gHb Est-mCnc: 249 mg/dL
Hgb A1c MFr Bld: 10.3 % — ABNORMAL HIGH (ref 4.8–5.6)

## 2020-02-23 LAB — LIPID PANEL
Chol/HDL Ratio: 2.6 ratio (ref 0.0–4.4)
Cholesterol, Total: 141 mg/dL (ref 100–199)
HDL: 55 mg/dL (ref >39)
LDL Chol Calc (NIH): 73 mg/dL (ref 0–99)
Triglycerides: 60 mg/dL (ref 0–149)
VLDL Cholesterol Cal: 13 mg/dL (ref 5–40)

## 2020-02-23 LAB — CARDIOVASCULAR RISK ASSESSMENT

## 2020-02-23 MED ORDER — JANUMET 50-1000 MG PO TABS: 1.0000 | ORAL_TABLET | Freq: Two times a day (BID) | ORAL | 3 refills | Status: DC

## 2020-02-28 ENCOUNTER — Other Ambulatory Visit: Payer: Self-pay | Admitting: Family Medicine

## 2020-02-28 DIAGNOSIS — E119 Type 2 diabetes mellitus without complications: Secondary | ICD-10-CM

## 2020-03-05 ENCOUNTER — Telehealth: Payer: Self-pay

## 2020-03-05 NOTE — Telephone Encounter (Signed)
The Janumet has glucophage in it --- her glucose was not controlled with just glucophage She should not have problem getting the coupon --- she can come by for few samples until she obtains coupon

## 2020-03-05 NOTE — Telephone Encounter (Signed)
Patient called and states since the dosage of her Janumet was increased she is unable to use her discount card. We do not have anymore discount cards here. She is requesting something else be called in if able. Please advise.

## 2020-03-05 NOTE — Telephone Encounter (Signed)
I called patient and informed her of information and she ask if she would be able to go back on Metformin instead. She states it was affordable for her. Please advise.

## 2020-03-05 NOTE — Telephone Encounter (Signed)
Patient agrees to continue Janumet. She is coming by office to pick up samples.

## 2020-03-05 NOTE — Telephone Encounter (Signed)
She can actually check the website or with the pharmacy for discount cards

## 2020-05-07 ENCOUNTER — Other Ambulatory Visit: Payer: Self-pay | Admitting: Physician Assistant

## 2020-05-07 DIAGNOSIS — E119 Type 2 diabetes mellitus without complications: Secondary | ICD-10-CM

## 2020-05-08 ENCOUNTER — Other Ambulatory Visit: Payer: Self-pay | Admitting: Physician Assistant

## 2020-05-08 DIAGNOSIS — E119 Type 2 diabetes mellitus without complications: Secondary | ICD-10-CM

## 2020-05-08 MED ORDER — JANUMET 50-1000 MG PO TABS: 1.0000 | ORAL_TABLET | Freq: Two times a day (BID) | ORAL | 3 refills | Status: DC

## 2020-05-23 ENCOUNTER — Ambulatory Visit: Payer: BC Managed Care – PPO | Admitting: Physician Assistant

## 2020-05-24 ENCOUNTER — Other Ambulatory Visit: Payer: Self-pay

## 2020-05-24 ENCOUNTER — Encounter: Payer: Self-pay | Admitting: Physician Assistant

## 2020-05-24 ENCOUNTER — Ambulatory Visit: Payer: BC Managed Care – PPO | Admitting: Physician Assistant

## 2020-05-24 VITALS — BP 120/78 | HR 82 | Temp 96.7°F | Ht 67.0 in | Wt 147.4 lb

## 2020-05-24 DIAGNOSIS — Z23 Encounter for immunization: Secondary | ICD-10-CM | POA: Diagnosis not present

## 2020-05-24 DIAGNOSIS — E782 Mixed hyperlipidemia: Secondary | ICD-10-CM

## 2020-05-24 DIAGNOSIS — J0110 Acute frontal sinusitis, unspecified: Secondary | ICD-10-CM | POA: Diagnosis not present

## 2020-05-24 DIAGNOSIS — E119 Type 2 diabetes mellitus without complications: Secondary | ICD-10-CM

## 2020-05-24 DIAGNOSIS — Z720 Tobacco use: Secondary | ICD-10-CM | POA: Diagnosis not present

## 2020-05-24 MED ORDER — AMOXICILLIN 875 MG PO TABS: 875.0000 mg | ORAL_TABLET | Freq: Two times a day (BID) | ORAL | 0 refills | Status: DC

## 2020-05-24 MED ORDER — VARENICLINE TARTRATE 0.5 MG PO TABS: 0.5000 mg | ORAL_TABLET | Freq: Two times a day (BID) | ORAL | 0 refills | Status: DC

## 2020-05-24 NOTE — Progress Notes (Signed)
Subjective:  Patient ID: Katie White, female    DOB: Jan 16, 1966  Age: 55 y.o. MRN: [id number]  Chief Complaint  Patient presents with  . Diabetes    HPI  pt with history of diabetes - before last visit she had stopped all of her medications for almost a year - but decided she was going to take better care of herself and now has been on medication again for past 69months, seen dietician and watching diet Current meds include janumet 50/1000 bid and zestril 2.5mg   Her glucose is doing better and having more numbers close to 150  Mixed hyperlipidemia  Pt presents with hyperlipidemia.  The patient is compliant with medications, maintains a low cholesterol diet , follows up as directed , and maintains an exercise regimen . The patient denies experiencing any hypercholesterolemia related symptoms. She is currently on crestor 5mg  qd  Pt complains of sinus pressure and drainage for the past several days - has trouble with allergies as well  Pt is smoking again and would like to try to quit - she has been on chantix in the past and would like to try again It did help her stop smoking but after pandemic she started again  Pt would like to update her tetanus shot and get pneumo shot today Current Outpatient Medications on File Prior to Visit  Medication Sig Dispense Refill  . aspirin EC 81 MG tablet Take 81 mg by mouth daily. Swallow whole.    . lisinopril (ZESTRIL) 2.5 MG tablet TAKE 1 TABLET BY MOUTH EVERY DAY 90 tablet 1  . Multiple Vitamin (MULTIVITAMIN) tablet Take 1 tablet by mouth daily.    . rosuvastatin (CRESTOR) 5 MG tablet Take 1 tablet (5 mg total) by mouth daily. 90 tablet 2  . sitaGLIPtin-metformin (JANUMET) 50-1000 MG tablet Take 1 tablet by mouth 2 (two) times daily with a meal. 60 tablet 3  . vitamin B-12 (CYANOCOBALAMIN) 100 MCG tablet Take 100 mcg by mouth daily.    . Vitamin D, Cholecalciferol, 10 MCG (400 UNIT) CHEW Chew by mouth.     No current facility-administered  medications on file prior to visit.   Past Medical History:  Diagnosis Date  . Mixed hyperlipidemia 11/16/2019  . Type 2 diabetes mellitus without complications (HCC) 11/16/2019   Past Surgical History:  Procedure Laterality Date  . CESAREAN SECTION  09/1984    Family History  Problem Relation Age of Onset  . Other Mother        covid 48  . Kidney failure Mother   . Stroke Mother   . Diabetes Mellitus II Mother   . Heart attack Brother   . Diabetes Mellitus II Brother    Social History   Socioeconomic History  . Marital status: Single    Spouse name: Not on file  . Number of children: 1  . Years of education: Not on file  . Highest education level: Not on file  Occupational History  . Occupation: 12  Tobacco Use  . Smoking status: Current Every Day Smoker    Packs/day: 0.50    Types: Cigarettes    Start date: 2020  . Smokeless tobacco: Never Used  Vaping Use  . Vaping Use: Never used  Substance and Sexual Activity  . Alcohol use: Yes    Alcohol/week: 1.0 standard drink    Types: 1 Glasses of wine per week    Comment: rarely  . Drug use: Never  . Sexual activity: Not Currently  Other Topics Concern  .  Not on file  Social History Narrative  . Not on file   Social Determinants of Health   Financial Resource Strain: Not on file  Food Insecurity: Not on file  Transportation Needs: Not on file  Physical Activity: Not on file  Stress: Not on file  Social Connections: Not on file    Review of Systems CONSTITUTIONAL: Negative for chills, fatigue, fever, unintentional weight gain and unintentional weight loss.  E/N/T: see HPI CARDIOVASCULAR: Negative for chest pain, dizziness, palpitations and pedal edema.  RESPIRATORY: Negative for recent cough and dyspnea.  GASTROINTESTINAL: Negative for abdominal pain, acid reflux symptoms, constipation, diarrhea, nausea and vomiting.  MSK: Negative for arthralgias and myalgias.  INTEGUMENTARY: Negative for rash.   NEUROLOGICAL: Negative for dizziness and headaches.  PSYCHIATRIC: Negative for sleep disturbance and to question depression screen.  Negative for depression, negative for anhedonia.           Objective:  BP 120/78 (BP Location: Right Arm, Patient Position: Sitting, Cuff Size: Normal)   Pulse 82   Temp (!) 96.7 F (35.9 C) (Temporal)   Ht 5\' 7"  (1.702 m)   Wt 147 lb 6.4 oz (66.9 kg)   SpO2 95%   BMI 23.09 kg/m   BP/Weight 05/24/2020 02/22/2020 11/16/2019  Systolic BP 120 120 118  Diastolic BP 78 72 70  Wt. (Lbs) 147.4 149 146.6  BMI 23.09 23.34 22.96    Physical Exam PHYSICAL EXAM:   VS: BP 120/78 (BP Location: Right Arm, Patient Position: Sitting, Cuff Size: Normal)   Pulse 82   Temp (!) 96.7 F (35.9 C) (Temporal)   Ht 5\' 7"  (1.702 m)   Wt 147 lb 6.4 oz (66.9 kg)   SpO2 95%   BMI 23.09 kg/m   PHYSICAL EXAM:   VS: BP 120/78 (BP Location: Right Arm, Patient Position: Sitting, Cuff Size: Normal)   Pulse 82   Temp (!) 96.7 F (35.9 C) (Temporal)   Ht 5\' 7"  (1.702 m)   Wt 147 lb 6.4 oz (66.9 kg)   SpO2 95%   BMI 23.09 kg/m   GEN: Well nourished, well developed, in no acute distress  HEENT: normal external ears and nose - normal external auditory canals and TMS - hearing grossly normal - normal nasal mucosa and septum - Lips, Teeth and Gums - normal  Oropharynx - erythema/pnd Neck: no JVD or masses - no thyromegaly Cardiac: RRR; no murmurs, rubs, or gallops,no edema -  Respiratory:  normal respiratory rate and pattern with no distress - normal breath sounds with no rales, rhonchi, wheezes or rubs  Skin: warm and dry, no rash  Neuro:  Alert and Oriented x 3, Strength and sensation are intact - CN II-Xii grossly intact Psych: euthymic mood, appropriate affect and demeanor   Diabetic Foot Exam - Simple   No data filed      Lab Results  Component Value Date   WBC 9.1 02/22/2020   HGB 12.3 02/22/2020   HCT 37.6 02/22/2020   PLT 258 02/22/2020   GLUCOSE 238  (H) 02/22/2020   CHOL 141 02/22/2020   TRIG 60 02/22/2020   HDL 55 02/22/2020   LDLCALC 73 02/22/2020   ALT 16 02/22/2020   AST 14 02/22/2020   NA 137 02/22/2020   K 4.3 02/22/2020   CL 99 02/22/2020   CREATININE 0.58 02/22/2020   BUN 8 02/22/2020   CO2 25 02/22/2020   TSH 1.100 11/16/2019   HGBA1C 10.3 (H) 02/22/2020   MICROALBUR 80 11/16/2019  Assessment & Plan:   1. Type 2 diabetes mellitus without complication, without long-term current use of insulin (HCC) - CBC with Differential/Platelet - Comprehensive metabolic panel - Hemoglobin A1c Continue current meds and will adjust according to labwork results 2. Mixed hyperlipidemia - Lipid panel  continue current meds as directed 3 - tobacco abuse rx for chantix 4 - sinusitis rx amoxil 875mg  bid  Meds ordered this encounter  Medications  . amoxicillin (AMOXIL) 875 MG tablet    Sig: Take 1 tablet (875 mg total) by mouth 2 (two) times daily.    Dispense:  20 tablet    Refill:  0    Order Specific Question:   Supervising Provider    Answer Blane Ohara  . varenicline (CHANTIX) 0.5 MG tablet    Sig: Take 1 tablet (0.5 mg total) by mouth 2 (two) times daily.    Dispense:  60 tablet    Refill:  0    Order Specific Question:   Supervising Provider    [id number]    Orders Placed This Encounter  Procedures  . Tdap vaccine greater than or equal to 7yo IM  . Pneumococcal polysaccharide vaccine 23-valent greater than or equal to 2yo subcutaneous/IM  . CBC with Differential/Platelet  . Comprehensive metabolic panel  . TSH  . Lipid panel  . Hemoglobin A1c        Follow-up: Return in about 6 months (around 11/24/2020) for chronic fasting follow up.  An After Visit Summary was printed and given to the patient.  01/24/2021 [facility] [phone]

## 2020-05-25 ENCOUNTER — Other Ambulatory Visit: Payer: Self-pay | Admitting: Physician Assistant

## 2020-05-25 LAB — LIPID PANEL
Chol/HDL Ratio: 2.4 ratio (ref 0.0–4.4)
Cholesterol, Total: 133 mg/dL (ref 100–199)
HDL: 55 mg/dL (ref >39)
LDL Chol Calc (NIH): 65 mg/dL (ref 0–99)
Triglycerides: 59 mg/dL (ref 0–149)
VLDL Cholesterol Cal: 13 mg/dL (ref 5–40)

## 2020-05-25 LAB — CBC WITH DIFFERENTIAL/PLATELET
Basophils Absolute: 0 10*3/uL (ref 0.0–0.2)
Basos: 0 %
EOS (ABSOLUTE): 0.1 10*3/uL (ref 0.0–0.4)
Eos: 1 %
Hematocrit: 34 % (ref 34.0–46.6)
Hemoglobin: 11.5 g/dL (ref 11.1–15.9)
Immature Grans (Abs): 0 10*3/uL (ref 0.0–0.1)
Immature Granulocytes: 0 %
Lymphocytes Absolute: 3.7 10*3/uL — ABNORMAL HIGH (ref 0.7–3.1)
Lymphs: 36 %
MCH: 32.9 pg (ref 26.6–33.0)
MCHC: 33.8 g/dL (ref 31.5–35.7)
MCV: 97 fL (ref 79–97)
Monocytes Absolute: 0.7 10*3/uL (ref 0.1–0.9)
Monocytes: 7 %
Neutrophils Absolute: 5.6 10*3/uL (ref 1.4–7.0)
Neutrophils: 56 %
Platelets: 250 10*3/uL (ref 150–450)
RBC: 3.5 x10E6/uL — ABNORMAL LOW (ref 3.77–5.28)
RDW: 13 % (ref 11.7–15.4)
WBC: 10.1 10*3/uL (ref 3.4–10.8)

## 2020-05-25 LAB — COMPREHENSIVE METABOLIC PANEL
ALT: 21 IU/L (ref 0–32)
AST: 20 IU/L (ref 0–40)
Albumin/Globulin Ratio: 1.4 (ref 1.2–2.2)
Albumin: 4.5 g/dL (ref 3.8–4.9)
Alkaline Phosphatase: 59 IU/L (ref 44–121)
BUN/Creatinine Ratio: 12 (ref 9–23)
BUN: 7 mg/dL (ref 6–24)
Bilirubin Total: 0.4 mg/dL (ref 0.0–1.2)
CO2: 23 mmol/L (ref 20–29)
Calcium: 9.4 mg/dL (ref 8.7–10.2)
Chloride: 99 mmol/L (ref 96–106)
Creatinine, Ser: 0.58 mg/dL (ref 0.57–1.00)
Globulin, Total: 3.2 g/dL (ref 1.5–4.5)
Glucose: 132 mg/dL — ABNORMAL HIGH (ref 65–99)
Potassium: 4.3 mmol/L (ref 3.5–5.2)
Sodium: 136 mmol/L (ref 134–144)
Total Protein: 7.7 g/dL (ref 6.0–8.5)
eGFR: 108 mL/min/{1.73_m2} (ref >59)

## 2020-05-25 LAB — TSH: TSH: 1.17 u[IU]/mL (ref 0.450–4.500)

## 2020-05-25 LAB — HEMOGLOBIN A1C
Est. average glucose Bld gHb Est-mCnc: 223 mg/dL
Hgb A1c MFr Bld: 9.4 % — ABNORMAL HIGH (ref 4.8–5.6)

## 2020-05-25 LAB — CARDIOVASCULAR RISK ASSESSMENT

## 2020-05-25 MED ORDER — PIOGLITAZONE HCL 15 MG PO TABS: 15.0000 mg | ORAL_TABLET | Freq: Every day | ORAL | 2 refills | Status: DC

## 2020-06-01 ENCOUNTER — Other Ambulatory Visit: Payer: Self-pay

## 2020-06-01 ENCOUNTER — Encounter: Payer: Self-pay | Admitting: Physician Assistant

## 2020-06-01 ENCOUNTER — Ambulatory Visit: Payer: BC Managed Care – PPO | Admitting: Physician Assistant

## 2020-06-01 VITALS — BP 112/64 | HR 80 | Temp 96.7°F | Ht 67.0 in | Wt 147.2 lb

## 2020-06-01 DIAGNOSIS — L723 Sebaceous cyst: Secondary | ICD-10-CM

## 2020-06-01 DIAGNOSIS — L089 Local infection of the skin and subcutaneous tissue, unspecified: Secondary | ICD-10-CM

## 2020-06-01 DIAGNOSIS — R221 Localized swelling, mass and lump, neck: Secondary | ICD-10-CM | POA: Diagnosis not present

## 2020-06-01 DIAGNOSIS — L729 Follicular cyst of the skin and subcutaneous tissue, unspecified: Secondary | ICD-10-CM | POA: Diagnosis not present

## 2020-06-01 MED ORDER — SULFAMETHOXAZOLE-TRIMETHOPRIM 800-160 MG PO TABS: 1.0000 | ORAL_TABLET | Freq: Two times a day (BID) | ORAL | 0 refills | Status: DC

## 2020-06-01 NOTE — Progress Notes (Signed)
Acute Office Visit  Subjective:    Patient ID: Katie White, female    DOB: 12-Oct-1965, 55 y.o.   MRN: [id number]  Chief Complaint  Patient presents with  . Bump on neck    HPI Patient is in today for knot on her neck - pt states that for about 2 years she had a knot on her anterior neck that was more like a large blackhead - over the past week it has swollen more and enlarged with some mild erythema - consistent with probable sebaceous cyst now infected She actually is on amoxicillin at this time  Past Medical History:  Diagnosis Date  . Mixed hyperlipidemia 11/16/2019  . Type 2 diabetes mellitus without complications (HCC) 11/16/2019    Past Surgical History:  Procedure Laterality Date  . CESAREAN SECTION  09/1984    Family History  Problem Relation Age of Onset  . Other Mother        covid 34  . Kidney failure Mother   . Stroke Mother   . Diabetes Mellitus II Mother   . Heart attack Brother   . Diabetes Mellitus II Brother     Social History   Socioeconomic History  . Marital status: Single    Spouse name: Not on file  . Number of children: 1  . Years of education: Not on file  . Highest education level: Not on file  Occupational History  . Occupation: Secondary school teacher  Tobacco Use  . Smoking status: Current Every Day Smoker    Packs/day: 0.50    Types: Cigarettes    Start date: 2020  . Smokeless tobacco: Never Used  Vaping Use  . Vaping Use: Never used  Substance and Sexual Activity  . Alcohol use: Yes    Alcohol/week: 1.0 standard drink    Types: 1 Glasses of wine per week    Comment: rarely  . Drug use: Never  . Sexual activity: Not Currently  Other Topics Concern  . Not on file  Social History Narrative  . Not on file   Social Determinants of Health   Financial Resource Strain: Not on file  Food Insecurity: Not on file  Transportation Needs: Not on file  Physical Activity: Not on file  Stress: Not on file  Social Connections: Not on file   Intimate Partner Violence: Not on file    Outpatient Medications Prior to Visit  Medication Sig Dispense Refill  . amoxicillin (AMOXIL) 875 MG tablet Take 1 tablet (875 mg total) by mouth 2 (two) times daily. 20 tablet 0  . aspirin EC 81 MG tablet Take 81 mg by mouth daily. Swallow whole.    . lisinopril (ZESTRIL) 2.5 MG tablet TAKE 1 TABLET BY MOUTH EVERY DAY 90 tablet 1  . Multiple Vitamin (MULTIVITAMIN) tablet Take 1 tablet by mouth daily.    . pioglitazone (ACTOS) 15 MG tablet Take 1 tablet (15 mg total) by mouth daily. 30 tablet 2  . rosuvastatin (CRESTOR) 5 MG tablet Take 1 tablet (5 mg total) by mouth daily. 90 tablet 2  . sitaGLIPtin-metformin (JANUMET) 50-1000 MG tablet Take 1 tablet by mouth 2 (two) times daily with a meal. 60 tablet 3  . varenicline (CHANTIX) 0.5 MG tablet Take 1 tablet (0.5 mg total) by mouth 2 (two) times daily. 60 tablet 0  . vitamin B-12 (CYANOCOBALAMIN) 100 MCG tablet Take 100 mcg by mouth daily.    . Vitamin D, Cholecalciferol, 10 MCG (400 UNIT) CHEW Chew by mouth.  No facility-administered medications prior to visit.    No Known Allergies  Review of Systems CONSTITUTIONAL: Negative for chills, fatigue, fever, unintentional weight gain and unintentional weight loss.  CARDIOVASCULAR: Negative for chest pain, dizziness, palpitations and pedal edema.  RESPIRATORY: Negative for recent cough and dyspnea.  INTEGUMENTARY: see HPI        Objective:    Physical Exam  BP 112/64 (BP Location: Left Arm, Patient Position: Sitting, Cuff Size: Normal)   Pulse 80   Temp (!) 96.7 F (35.9 C) (Temporal)   Ht 5\' 7"  (1.702 m)   Wt 147 lb 3.2 oz (66.8 kg)   SpO2 98%   BMI 23.05 kg/m  Wt Readings from Last 3 Encounters:  06/01/20 147 lb 3.2 oz (66.8 kg)  05/24/20 147 lb 6.4 oz (66.9 kg)  02/22/20 149 lb (67.6 kg)   PHYSICAL EXAM:   VS: BP 112/64 (BP Location: Left Arm, Patient Position: Sitting, Cuff Size: Normal)   Pulse 80   Temp (!) 96.7 F (35.9  C) (Temporal)   Ht 5\' 7"  (1.702 m)   Wt 147 lb 3.2 oz (66.8 kg)   SpO2 98%   BMI 23.05 kg/m   GEN: Well nourished, well developed, in no acute distress  Neck - what appear to be inflamed sebaceous cyst on anterior neck with mild lymphadenopathy also noted Cardiac: RRR; no murmurs, rubs, or gallops,no edema - no significant varicosities Respiratory:  normal respiratory rate and pattern with no distress - normal breath sounds with no rales, rhonchi, wheezes or rubs  Health Maintenance Due  Topic Date Due  . PAP SMEAR-Modifier  Never done    There are no preventive care reminders to display for this patient.   Lab Results  Component Value Date   TSH 1.170 05/24/2020   Lab Results  Component Value Date   WBC 10.1 05/24/2020   HGB 11.5 05/24/2020   HCT 34.0 05/24/2020   MCV 97 05/24/2020   PLT 250 05/24/2020   Lab Results  Component Value Date   NA 136 05/24/2020   K 4.3 05/24/2020   CO2 23 05/24/2020   GLUCOSE 132 (H) 05/24/2020   BUN 7 05/24/2020   CREATININE 0.58 05/24/2020   BILITOT 0.4 05/24/2020   ALKPHOS 59 05/24/2020   AST 20 05/24/2020   ALT 21 05/24/2020   PROT 7.7 05/24/2020   ALBUMIN 4.5 05/24/2020   CALCIUM 9.4 05/24/2020   Lab Results  Component Value Date   CHOL 133 05/24/2020   Lab Results  Component Value Date   HDL 55 05/24/2020   Lab Results  Component Value Date   LDLCALC 65 05/24/2020   Lab Results  Component Value Date   TRIG 59 05/24/2020   Lab Results  Component Value Date   CHOLHDL 2.4 05/24/2020   Lab Results  Component Value Date   HGBA1C 9.4 (H) 05/24/2020       Assessment & Plan:  1. Infected sebaceous cyst of skin - sulfamethoxazole-trimethoprim (BACTRIM DS) 800-160 MG tablet; Take 1 tablet by mouth 2 (two) times daily.  Dispense: 20 tablet; Refill: 0  also finish amoxil she is already taking  appt made today with Dr 07/24/2020 for further evaluation at 1:30  Meds ordered this encounter  Medications  .  sulfamethoxazole-trimethoprim (BACTRIM DS) 800-160 MG tablet    Sig: Take 1 tablet by mouth 2 (two) times daily.    Dispense:  20 tablet    Refill:  0    Order Specific Question:  Supervising Provider    Answer:   Blane Ohara [phone]    No orders of the defined types were placed in this encounter.   Follow-up: Return if symptoms worsen or fail to improve.  An After Visit Summary was printed and given to the patient.  Jettie Pagan [facility] [phone]

## 2020-06-06 DIAGNOSIS — L729 Follicular cyst of the skin and subcutaneous tissue, unspecified: Secondary | ICD-10-CM | POA: Diagnosis not present

## 2020-06-07 DIAGNOSIS — L720 Epidermal cyst: Secondary | ICD-10-CM | POA: Diagnosis not present

## 2020-06-11 DIAGNOSIS — Z09 Encounter for follow-up examination after completed treatment for conditions other than malignant neoplasm: Secondary | ICD-10-CM | POA: Diagnosis not present

## 2020-06-14 DIAGNOSIS — Z09 Encounter for follow-up examination after completed treatment for conditions other than malignant neoplasm: Secondary | ICD-10-CM | POA: Diagnosis not present

## 2020-06-19 DIAGNOSIS — L729 Follicular cyst of the skin and subcutaneous tissue, unspecified: Secondary | ICD-10-CM | POA: Diagnosis not present

## 2020-06-22 ENCOUNTER — Other Ambulatory Visit: Payer: Self-pay | Admitting: Physician Assistant

## 2020-06-22 DIAGNOSIS — Z720 Tobacco use: Secondary | ICD-10-CM

## 2020-07-17 ENCOUNTER — Other Ambulatory Visit: Payer: Self-pay | Admitting: Physician Assistant

## 2020-07-17 DIAGNOSIS — Z720 Tobacco use: Secondary | ICD-10-CM

## 2020-08-05 ENCOUNTER — Other Ambulatory Visit: Payer: Self-pay | Admitting: Physician Assistant

## 2020-08-05 DIAGNOSIS — E119 Type 2 diabetes mellitus without complications: Secondary | ICD-10-CM

## 2020-08-10 ENCOUNTER — Telehealth: Payer: Self-pay

## 2020-08-10 NOTE — Telephone Encounter (Signed)
There is not a smallpox vaccine available to general public at this time

## 2020-08-10 NOTE — Telephone Encounter (Signed)
Pt calling asking about small pox vaccine for monkey pox. States she is concerned about her and her son and would like to "be ahead of the game."

## 2020-08-10 NOTE — Telephone Encounter (Signed)
Made pt aware.   Lorita Officer, [place] 08/10/20 9:51 AM

## 2020-08-21 ENCOUNTER — Other Ambulatory Visit: Payer: Self-pay | Admitting: Physician Assistant

## 2020-08-21 DIAGNOSIS — Z720 Tobacco use: Secondary | ICD-10-CM

## 2020-08-30 ENCOUNTER — Ambulatory Visit: Payer: BC Managed Care – PPO | Admitting: Physician Assistant

## 2020-09-21 ENCOUNTER — Other Ambulatory Visit: Payer: Self-pay | Admitting: Family Medicine

## 2020-09-21 DIAGNOSIS — E119 Type 2 diabetes mellitus without complications: Secondary | ICD-10-CM

## 2020-09-22 ENCOUNTER — Other Ambulatory Visit: Payer: Self-pay | Admitting: Family Medicine

## 2020-09-22 DIAGNOSIS — Z720 Tobacco use: Secondary | ICD-10-CM

## 2020-09-24 NOTE — Telephone Encounter (Signed)
Your pt. Not sure if still on. kc

## 2020-10-20 ENCOUNTER — Other Ambulatory Visit: Payer: Self-pay | Admitting: Physician Assistant

## 2020-10-20 DIAGNOSIS — Z720 Tobacco use: Secondary | ICD-10-CM

## 2020-10-23 ENCOUNTER — Ambulatory Visit (INDEPENDENT_AMBULATORY_CARE_PROVIDER_SITE_OTHER): Payer: BC Managed Care – PPO

## 2020-10-23 ENCOUNTER — Other Ambulatory Visit: Payer: Self-pay

## 2020-10-23 ENCOUNTER — Encounter: Payer: Self-pay | Admitting: Physician Assistant

## 2020-10-23 ENCOUNTER — Ambulatory Visit: Payer: BC Managed Care – PPO | Admitting: Physician Assistant

## 2020-10-23 VITALS — BP 110/68 | HR 67 | Temp 96.6°F | Ht 67.0 in | Wt 152.6 lb

## 2020-10-23 DIAGNOSIS — Z23 Encounter for immunization: Secondary | ICD-10-CM | POA: Diagnosis not present

## 2020-10-23 DIAGNOSIS — F419 Anxiety disorder, unspecified: Secondary | ICD-10-CM | POA: Diagnosis not present

## 2020-10-23 MED ORDER — CITALOPRAM HYDROBROMIDE 10 MG PO TABS: 10.0000 mg | ORAL_TABLET | Freq: Every day | ORAL | 3 refills | Status: DC

## 2020-10-23 NOTE — Progress Notes (Signed)
Subjective:  Patient ID: Katie White, female    DOB: Apr 30, 1965  Age: 55 y.o. MRN: [id number]  Chief Complaint  Patient presents with   Anxiety    HPI  Pt states that she has had trouble with anxiety for about 3 years - states she has trouble focusing, mind wandering, feeling anxious and even with some mild depressive symptoms - she has not been on medication for this in the past and would like to try Current Outpatient Medications on File Prior to Visit  Medication Sig Dispense Refill   aspirin EC 81 MG tablet Take 81 mg by mouth daily. Swallow whole.     lisinopril (ZESTRIL) 2.5 MG tablet TAKE 1 TABLET BY MOUTH EVERY DAY 90 tablet 1   Multiple Vitamin (MULTIVITAMIN) tablet Take 1 tablet by mouth daily.     pioglitazone (ACTOS) 15 MG tablet Take 1 tablet (15 mg total) by mouth daily. 30 tablet 2   rosuvastatin (CRESTOR) 5 MG tablet TAKE 1 TABLET BY MOUTH EVERY DAY 90 tablet 0   sitaGLIPtin-metformin (JANUMET) 50-1000 MG tablet Take 1 tablet by mouth 2 (two) times daily with a meal. 60 tablet 3   vitamin B-12 (CYANOCOBALAMIN) 100 MCG tablet Take 100 mcg by mouth daily.     Vitamin D, Cholecalciferol, 10 MCG (400 UNIT) CHEW Chew by mouth.     No current facility-administered medications on file prior to visit.   Past Medical History:  Diagnosis Date   Mixed hyperlipidemia 11/16/2019   Type 2 diabetes mellitus without complications (HCC) 11/16/2019   Past Surgical History:  Procedure Laterality Date   CESAREAN SECTION  09/1984    Family History  Problem Relation Age of Onset   Other Mother        covid 68   Kidney failure Mother    Stroke Mother    Diabetes Mellitus II Mother    Heart attack Brother    Diabetes Mellitus II Brother    Social History   Socioeconomic History   Marital status: Single    Spouse name: Not on file   Number of children: 1   Years of education: Not on file   Highest education level: Not on file  Occupational History   Occupation: Instructor   Tobacco Use   Smoking status: Every Day    Packs/day: 0.50    Types: Cigarettes    Start date: 2020   Smokeless tobacco: Never  Vaping Use   Vaping Use: Never used  Substance and Sexual Activity   Alcohol use: Yes    Alcohol/week: 1.0 standard drink    Types: 1 Glasses of wine per week    Comment: rarely   Drug use: Never   Sexual activity: Not Currently  Other Topics Concern   Not on file  Social History Narrative   Not on file   Social Determinants of Health   Financial Resource Strain: Not on file  Food Insecurity: Not on file  Transportation Needs: Not on file  Physical Activity: Not on file  Stress: Not on file  Social Connections: Not on file    Review of Systems CONSTITUTIONAL: Negative for chills, fatigue, fever, unintentional weight gain and unintentional weight loss.  CARDIOVASCULAR: Negative for chest pain, dizziness, palpitations and pedal edema.  RESPIRATORY: Negative for recent cough and dyspnea.  PSYCHIATRIC: see HPI      Objective:  PHYSICAL EXAM:   VS: BP 110/68 (BP Location: Left Arm, Patient Position: Sitting, Cuff Size: Normal)   Pulse 67  Temp (!) 96.6 F (35.9 C) (Temporal)   Ht 5\' 7"  (1.702 m)   Wt 152 lb 9.6 oz (69.2 kg)   SpO2 95%   BMI 23.90 kg/m   GEN: Well nourished, well developed, in no acute distress  Cardiac: RRR; no murmurs, rubs, or gallops, Respiratory:  normal respiratory rate and pattern with no distress - normal breath sounds with no rales, rhonchi, wheezes or rubs Psych: euthymic mood, appropriate affect and demeanor  Diabetic Foot Exam - Simple   No data filed      Lab Results  Component Value Date   WBC 10.1 05/24/2020   HGB 11.5 05/24/2020   HCT 34.0 05/24/2020   PLT 250 05/24/2020   GLUCOSE 132 (H) 05/24/2020   CHOL 133 05/24/2020   TRIG 59 05/24/2020   HDL 55 05/24/2020   LDLCALC 65 05/24/2020   ALT 21 05/24/2020   AST 20 05/24/2020   NA 136 05/24/2020   K 4.3 05/24/2020   CL 99 05/24/2020    CREATININE 0.58 05/24/2020   BUN 7 05/24/2020   CO2 23 05/24/2020   TSH 1.170 05/24/2020   HGBA1C 9.4 (H) 05/24/2020   MICROALBUR 80 11/16/2019      Assessment & Plan:   1. Anxiety - citalopram (CELEXA) 10 MG tablet; Take 1 tablet (10 mg total) by mouth daily.  Dispense: 30 tablet; Refill: 3    Meds ordered this encounter  Medications   citalopram (CELEXA) 10 MG tablet    Sig: Take 1 tablet (10 mg total) by mouth daily.    Dispense:  30 tablet    Refill:  3    Order Specific Question:   Supervising Provider    Answer:   01/16/2020    No orders of the defined types were placed in this encounter.    Follow-up: Return if symptoms worsen or fail to improve. - follow up next month for chronic visit  An After Visit Summary was printed and given to the patient.  Corey Harold [facility] [phone]

## 2020-10-23 NOTE — Progress Notes (Signed)
   Covid-[facility] Vaccination [facility]  Name:  Tinea Nobile    MRN: [id number] DOB: 03-26-1965  10/23/2020  Ms. Bulluck was observed post Covid-19 immunization for 15 minutes without incident. She was provided with Vaccine Information Sheet and instruction to access the V-Safe system.   Ms. Potier was instructed to call [phone] with any severe reactions post vaccine: Difficulty breathing  Swelling of face and throat  A fast heartbeat  A bad rash all over body  Dizziness and weakness

## 2020-11-03 ENCOUNTER — Other Ambulatory Visit: Payer: Self-pay | Admitting: Family Medicine

## 2020-11-03 DIAGNOSIS — E119 Type 2 diabetes mellitus without complications: Secondary | ICD-10-CM

## 2020-11-14 ENCOUNTER — Other Ambulatory Visit: Payer: Self-pay | Admitting: Physician Assistant

## 2020-11-14 DIAGNOSIS — Z720 Tobacco use: Secondary | ICD-10-CM

## 2020-11-14 DIAGNOSIS — F419 Anxiety disorder, unspecified: Secondary | ICD-10-CM

## 2020-11-26 ENCOUNTER — Encounter: Payer: Self-pay | Admitting: Physician Assistant

## 2020-11-26 ENCOUNTER — Ambulatory Visit: Payer: BC Managed Care – PPO | Admitting: Physician Assistant

## 2020-11-26 ENCOUNTER — Other Ambulatory Visit: Payer: Self-pay

## 2020-11-26 VITALS — BP 112/68 | HR 76 | Temp 96.1°F | Ht 67.0 in | Wt 151.0 lb

## 2020-11-26 DIAGNOSIS — E782 Mixed hyperlipidemia: Secondary | ICD-10-CM

## 2020-11-26 DIAGNOSIS — E119 Type 2 diabetes mellitus without complications: Secondary | ICD-10-CM | POA: Diagnosis not present

## 2020-11-26 DIAGNOSIS — Z23 Encounter for immunization: Secondary | ICD-10-CM | POA: Diagnosis not present

## 2020-11-26 DIAGNOSIS — E559 Vitamin D deficiency, unspecified: Secondary | ICD-10-CM | POA: Diagnosis not present

## 2020-11-26 DIAGNOSIS — F419 Anxiety disorder, unspecified: Secondary | ICD-10-CM

## 2020-11-26 DIAGNOSIS — Z1231 Encounter for screening mammogram for malignant neoplasm of breast: Secondary | ICD-10-CM

## 2020-11-26 MED ORDER — CITALOPRAM HYDROBROMIDE 10 MG PO TABS: 10.0000 mg | ORAL_TABLET | Freq: Every day | ORAL | 1 refills | Status: DC

## 2020-11-26 NOTE — Progress Notes (Signed)
Subjective:  Patient ID: Katie White, female    DOB: 1965/10/14  Age: 55 y.o. MRN: [id number]  Chief Complaint  Patient presents with   Diabetes    HPI  Diabetes Mellitus Type II, Follow-up Currently on janumet, actos, zestril and crestor Lab Results  Component Value Date   HGBA1C 9.4 (H) 05/24/2020   HGBA1C 10.3 (H) 02/22/2020   HGBA1C 14.5 (H) 11/16/2019   Wt Readings from Last 3 Encounters:  11/26/20 151 lb (68.5 kg)  10/23/20 152 lb 9.6 oz (69.2 kg)  06/01/20 147 lb 3.2 oz (66.8 kg)   Last seen for diabetes 6 months ago.  She reports good compliance with treatment. She is not having side effects.  Symptoms: No fatigue No foot ulcerations  No appetite changes No nausea  No paresthesia of the feet  No polydipsia  No polyuria No visual disturbances   No vomiting     Home blood sugar records: fasting range: 140-160  Episodes of hypoglycemia? No   Most Recent Eye Exam: is due  Pertinent Labs: Lab Results  Component Value Date   CHOL 133 05/24/2020   HDL 55 05/24/2020   LDLCALC 65 05/24/2020   TRIG 59 05/24/2020   CHOLHDL 2.4 05/24/2020   Lab Results  Component Value Date   NA 136 05/24/2020   K 4.3 05/24/2020   CREATININE 0.58 05/24/2020   GFRNONAA 106 02/22/2020   GFRAA 123 02/22/2020   GLUCOSE 132 (H) 05/24/2020      Pt for follow up of anxiety - states she is doing well on celexa  Pt would like flu shot today Would like mammogram scheduled Current Outpatient Medications on File Prior to Visit  Medication Sig Dispense Refill   aspirin EC 81 MG tablet Take 81 mg by mouth daily. Swallow whole.     lisinopril (ZESTRIL) 2.5 MG tablet TAKE 1 TABLET BY MOUTH EVERY DAY 90 tablet 1   Multiple Vitamin (MULTIVITAMIN) tablet Take 1 tablet by mouth daily.     pioglitazone (ACTOS) 15 MG tablet Take 1 tablet (15 mg total) by mouth daily. 30 tablet 2   rosuvastatin (CRESTOR) 5 MG tablet TAKE 1 TABLET BY MOUTH EVERY DAY 90 tablet 0   sitaGLIPtin-metformin  (JANUMET) 50-1000 MG tablet Take 1 tablet by mouth 2 (two) times daily with a meal. 60 tablet 3   vitamin B-12 (CYANOCOBALAMIN) 100 MCG tablet Take 100 mcg by mouth daily.     Vitamin D, Cholecalciferol, 10 MCG (400 UNIT) CHEW Chew by mouth.     No current facility-administered medications on file prior to visit.   Past Medical History:  Diagnosis Date   Mixed hyperlipidemia 11/16/2019   Type 2 diabetes mellitus without complications (HCC) 11/16/2019   Past Surgical History:  Procedure Laterality Date   CESAREAN SECTION  09/1984    Family History  Problem Relation Age of Onset   Other Mother        covid 21   Kidney failure Mother    Stroke Mother    Diabetes Mellitus II Mother    Heart attack Brother    Diabetes Mellitus II Brother    Social History   Socioeconomic History   Marital status: Single    Spouse name: Not on file   Number of children: 1   Years of education: Not on file   Highest education level: Not on file  Occupational History   Occupation: Instructor  Tobacco Use   Smoking status: Every Day    Packs/day: 0.50  Types: Cigarettes    Start date: 2020   Smokeless tobacco: Never  Vaping Use   Vaping Use: Never used  Substance and Sexual Activity   Alcohol use: Yes    Alcohol/week: 1.0 standard drink    Types: 1 Glasses of wine per week    Comment: rarely   Drug use: Never   Sexual activity: Not Currently  Other Topics Concern   Not on file  Social History Narrative   Not on file   Social Determinants of Health   Financial Resource Strain: Not on file  Food Insecurity: Not on file  Transportation Needs: Not on file  Physical Activity: Not on file  Stress: Not on file  Social Connections: Not on file    Review of Systems CONSTITUTIONAL: Negative for chills, fatigue, fever, unintentional weight gain and unintentional weight loss.  E/N/T: Negative for ear pain, nasal congestion and sore throat.  CARDIOVASCULAR: Negative for chest pain,  dizziness, palpitations and pedal edema.  RESPIRATORY: Negative for recent cough and dyspnea.  GASTROINTESTINAL: Negative for abdominal pain, acid reflux symptoms, constipation, diarrhea, nausea and vomiting.  MSK: Negative for arthralgias and myalgias.  INTEGUMENTARY: Negative for rash.  NEUROLOGICAL: Negative for dizziness and headaches.  PSYCHIATRIC: Negative for sleep disturbance and to question depression screen.  Negative for depression, negative for anhedonia.       Objective:  BP 112/68 (BP Location: Left Arm, Patient Position: Sitting, Cuff Size: Normal)   Pulse 76   Temp (!) 96.1 F (35.6 C) (Temporal)   Ht 5\' 7"  (1.702 m)   Wt 151 lb (68.5 kg)   SpO2 92%   BMI 23.65 kg/m   BP/Weight 11/26/2020 10/23/2020 06/01/2020  Systolic BP 112 110 112  Diastolic BP 68 68 64  Wt. (Lbs) 151 152.6 147.2  BMI 23.65 23.9 23.05    Physical Exam PHYSICAL EXAM:   VS: BP 112/68 (BP Location: Left Arm, Patient Position: Sitting, Cuff Size: Normal)   Pulse 76   Temp (!) 96.1 F (35.6 C) (Temporal)   Ht 5\' 7"  (1.702 m)   Wt 151 lb (68.5 kg)   SpO2 92%   BMI 23.65 kg/m   GEN: Well nourished, well developed, in no acute distress  Cardiac: RRR; no murmurs, rubs, or gallops,no edema -  Respiratory:  normal respiratory rate and pattern with no distress - normal breath sounds with no rales, rhonchi, wheezes or rubs  Neuro:  Alert and Oriented x 3, Strength and sensation are intact - CN II-Xii grossly intact Psych: euthymic mood, appropriate affect and demeanor  Diabetic Foot Exam - Simple   No data filed      Lab Results  Component Value Date   WBC 10.1 05/24/2020   HGB 11.5 05/24/2020   HCT 34.0 05/24/2020   PLT 250 05/24/2020   GLUCOSE 132 (H) 05/24/2020   CHOL 133 05/24/2020   TRIG 59 05/24/2020   HDL 55 05/24/2020   LDLCALC 65 05/24/2020   ALT 21 05/24/2020   AST 20 05/24/2020   NA 136 05/24/2020   K 4.3 05/24/2020   CL 99 05/24/2020   CREATININE 0.58 05/24/2020    BUN 7 05/24/2020   CO2 23 05/24/2020   TSH 1.170 05/24/2020   HGBA1C 9.4 (H) 05/24/2020   MICROALBUR 80 11/16/2019      Assessment & Plan:   Problem List Items Addressed This Visit       Endocrine   Type 2 diabetes mellitus without complications (HCC) - Primary   Relevant  Orders   CBC with Differential/Platelet   Comprehensive metabolic panel   TSH   Hemoglobin A1c   Lipid panel     Other   Mixed hyperlipidemia   Relevant Orders   Lipid panel   Other Visit Diagnoses     Vitamin D insufficiency       Relevant Orders   VITAMIN D 25 Hydroxy (Vit-D Deficiency, Fractures)   Need for prophylactic vaccination and inoculation against influenza       Relevant Orders   Flu Vaccine MDCK QUAD PF   Visit for screening mammogram       Relevant Orders   MM DIGITAL SCREENING BILATERAL   Anxiety       Relevant Medications   citalopram (CELEXA) 10 MG tablet     .  Meds ordered this encounter  Medications   citalopram (CELEXA) 10 MG tablet    Sig: Take 1 tablet (10 mg total) by mouth daily.    Dispense:  90 tablet    Refill:  1    Order Specific Question:   Supervising Provider    Answer:   Corey Harold    Orders Placed This Encounter  Procedures   MM DIGITAL SCREENING BILATERAL   Flu Vaccine MDCK QUAD PF   CBC with Differential/Platelet   Comprehensive metabolic panel   TSH   Hemoglobin A1c   Lipid panel   VITAMIN D 25 Hydroxy (Vit-D Deficiency, Fractures)     Follow-up: Return in about 6 months (around 05/26/2021) for chronic fasting follow up.  An After Visit Summary was printed and given to the patient.  Jettie Pagan [facility] [phone]

## 2020-11-27 ENCOUNTER — Other Ambulatory Visit: Payer: Self-pay | Admitting: Physician Assistant

## 2020-11-27 DIAGNOSIS — E119 Type 2 diabetes mellitus without complications: Secondary | ICD-10-CM

## 2020-11-27 LAB — HEMOGLOBIN A1C
Est. average glucose Bld gHb Est-mCnc: 186 mg/dL
Hgb A1c MFr Bld: 8.1 % — ABNORMAL HIGH (ref 4.8–5.6)

## 2020-11-27 LAB — CBC WITH DIFFERENTIAL/PLATELET
Basophils Absolute: 0 10*3/uL (ref 0.0–0.2)
Basos: 0 %
EOS (ABSOLUTE): 0.2 10*3/uL (ref 0.0–0.4)
Eos: 2 %
Hematocrit: 34.7 % (ref 34.0–46.6)
Hemoglobin: 11.6 g/dL (ref 11.1–15.9)
Immature Grans (Abs): 0 10*3/uL (ref 0.0–0.1)
Immature Granulocytes: 0 %
Lymphocytes Absolute: 4 10*3/uL — ABNORMAL HIGH (ref 0.7–3.1)
Lymphs: 42 %
MCH: 32.3 pg (ref 26.6–33.0)
MCHC: 33.4 g/dL (ref 31.5–35.7)
MCV: 97 fL (ref 79–97)
Monocytes Absolute: 0.7 10*3/uL (ref 0.1–0.9)
Monocytes: 8 %
Neutrophils Absolute: 4.6 10*3/uL (ref 1.4–7.0)
Neutrophils: 48 %
Platelets: 220 10*3/uL (ref 150–450)
RBC: 3.59 x10E6/uL — ABNORMAL LOW (ref 3.77–5.28)
RDW: 12.9 % (ref 11.7–15.4)
WBC: 9.6 10*3/uL (ref 3.4–10.8)

## 2020-11-27 LAB — COMPREHENSIVE METABOLIC PANEL
ALT: 18 IU/L (ref 0–32)
AST: 18 IU/L (ref 0–40)
Albumin/Globulin Ratio: 1.7 (ref 1.2–2.2)
Albumin: 4.7 g/dL (ref 3.8–4.9)
Alkaline Phosphatase: 58 IU/L (ref 44–121)
BUN/Creatinine Ratio: 16 (ref 9–23)
BUN: 9 mg/dL (ref 6–24)
Bilirubin Total: 0.2 mg/dL (ref 0.0–1.2)
CO2: 22 mmol/L (ref 20–29)
Calcium: 9.4 mg/dL (ref 8.7–10.2)
Chloride: 99 mmol/L (ref 96–106)
Creatinine, Ser: 0.56 mg/dL — ABNORMAL LOW (ref 0.57–1.00)
Globulin, Total: 2.7 g/dL (ref 1.5–4.5)
Glucose: 150 mg/dL — ABNORMAL HIGH (ref 65–99)
Potassium: 4.1 mmol/L (ref 3.5–5.2)
Sodium: 135 mmol/L (ref 134–144)
Total Protein: 7.4 g/dL (ref 6.0–8.5)
eGFR: 109 mL/min/{1.73_m2} (ref >59)

## 2020-11-27 LAB — LIPID PANEL
Chol/HDL Ratio: 2.7 ratio (ref 0.0–4.4)
Cholesterol, Total: 133 mg/dL (ref 100–199)
HDL: 49 mg/dL (ref >39)
LDL Chol Calc (NIH): 65 mg/dL (ref 0–99)
Triglycerides: 105 mg/dL (ref 0–149)
VLDL Cholesterol Cal: 19 mg/dL (ref 5–40)

## 2020-11-27 LAB — TSH: TSH: 1.42 u[IU]/mL (ref 0.450–4.500)

## 2020-11-27 LAB — VITAMIN D 25 HYDROXY (VIT D DEFICIENCY, FRACTURES): Vit D, 25-Hydroxy: 49.4 ng/mL (ref 30.0–100.0)

## 2020-11-27 LAB — CARDIOVASCULAR RISK ASSESSMENT

## 2020-11-27 MED ORDER — PIOGLITAZONE HCL 30 MG PO TABS
30.0000 mg | ORAL_TABLET | Freq: Every day | ORAL | 1 refills | Status: DC
Start: 2020-11-27 — End: 2021-09-20

## 2020-12-03 ENCOUNTER — Telehealth: Payer: Self-pay | Admitting: Physician Assistant

## 2020-12-03 NOTE — Telephone Encounter (Signed)
   Katie White has been scheduled for the following appointment:  WHAT: SCREENING MAMMOGRAM WHERE: [facility] DATE: 12/18/20 TIME: 1:15 PM ARRIVAL TIME  A message has been left for the patient.

## 2020-12-15 ENCOUNTER — Other Ambulatory Visit: Payer: Self-pay | Admitting: Physician Assistant

## 2020-12-15 DIAGNOSIS — E119 Type 2 diabetes mellitus without complications: Secondary | ICD-10-CM

## 2020-12-24 ENCOUNTER — Telehealth: Payer: Self-pay

## 2020-12-24 NOTE — Telephone Encounter (Signed)
PT NO SHOWED MAMMOGRAM ON 12/18/20

## 2020-12-24 NOTE — Telephone Encounter (Signed)
Katie White called requesting a co-pay card for her Janumet 50/1000.  We have no samples available and currently no cards.  She is going to try to get one from the internet and I suggested she try [facility].

## 2021-02-04 ENCOUNTER — Other Ambulatory Visit: Payer: Self-pay | Admitting: Family Medicine

## 2021-02-04 DIAGNOSIS — E119 Type 2 diabetes mellitus without complications: Secondary | ICD-10-CM

## 2021-03-24 ENCOUNTER — Other Ambulatory Visit: Payer: Self-pay | Admitting: Family Medicine

## 2021-03-24 DIAGNOSIS — E119 Type 2 diabetes mellitus without complications: Secondary | ICD-10-CM

## 2021-04-27 ENCOUNTER — Other Ambulatory Visit: Payer: Self-pay | Admitting: Physician Assistant

## 2021-04-27 DIAGNOSIS — E119 Type 2 diabetes mellitus without complications: Secondary | ICD-10-CM

## 2021-04-28 ENCOUNTER — Other Ambulatory Visit: Payer: Self-pay | Admitting: Physician Assistant

## 2021-04-28 DIAGNOSIS — E119 Type 2 diabetes mellitus without complications: Secondary | ICD-10-CM

## 2021-05-23 ENCOUNTER — Telehealth: Payer: Self-pay

## 2021-05-23 NOTE — Telephone Encounter (Signed)
I left a message on the number(s) listed in the patients chart requesting the patient to call back regarding the upcomming appointment for 05/30/2021. The provider is out of the office that day. The appointment has been canceled. Waiting for the patient to return the call. ?

## 2021-05-23 NOTE — Telephone Encounter (Signed)
Patient stated she got the message about her appointment needing to be canceled/rescheduled. Patient stated she will call back to get it rescheduled. ?

## 2021-05-29 ENCOUNTER — Other Ambulatory Visit: Payer: Self-pay | Admitting: Physician Assistant

## 2021-05-29 DIAGNOSIS — E119 Type 2 diabetes mellitus without complications: Secondary | ICD-10-CM

## 2021-05-30 ENCOUNTER — Ambulatory Visit: Payer: BC Managed Care – PPO | Admitting: Physician Assistant

## 2021-07-30 ENCOUNTER — Other Ambulatory Visit: Payer: Self-pay | Admitting: Physician Assistant

## 2021-07-30 DIAGNOSIS — E119 Type 2 diabetes mellitus without complications: Secondary | ICD-10-CM

## 2021-08-24 ENCOUNTER — Other Ambulatory Visit: Payer: Self-pay | Admitting: Physician Assistant

## 2021-08-24 DIAGNOSIS — E119 Type 2 diabetes mellitus without complications: Secondary | ICD-10-CM

## 2021-08-25 ENCOUNTER — Other Ambulatory Visit: Payer: Self-pay | Admitting: Physician Assistant

## 2021-08-25 DIAGNOSIS — F419 Anxiety disorder, unspecified: Secondary | ICD-10-CM

## 2021-09-08 ENCOUNTER — Other Ambulatory Visit: Payer: Self-pay | Admitting: Physician Assistant

## 2021-09-08 DIAGNOSIS — E119 Type 2 diabetes mellitus without complications: Secondary | ICD-10-CM

## 2021-09-20 ENCOUNTER — Ambulatory Visit: Payer: BC Managed Care – PPO | Admitting: Physician Assistant

## 2021-09-20 ENCOUNTER — Encounter: Payer: Self-pay | Admitting: Physician Assistant

## 2021-09-20 VITALS — BP 110/68 | HR 72 | Temp 97.0°F | Ht 67.0 in | Wt 152.0 lb

## 2021-09-20 DIAGNOSIS — E119 Type 2 diabetes mellitus without complications: Secondary | ICD-10-CM | POA: Diagnosis not present

## 2021-09-20 DIAGNOSIS — F419 Anxiety disorder, unspecified: Secondary | ICD-10-CM | POA: Diagnosis not present

## 2021-09-20 DIAGNOSIS — E782 Mixed hyperlipidemia: Secondary | ICD-10-CM | POA: Diagnosis not present

## 2021-09-20 DIAGNOSIS — E1165 Type 2 diabetes mellitus with hyperglycemia: Secondary | ICD-10-CM | POA: Diagnosis not present

## 2021-09-20 DIAGNOSIS — Z1231 Encounter for screening mammogram for malignant neoplasm of breast: Secondary | ICD-10-CM

## 2021-09-20 DIAGNOSIS — E559 Vitamin D deficiency, unspecified: Secondary | ICD-10-CM | POA: Diagnosis not present

## 2021-09-20 MED ORDER — LISINOPRIL 2.5 MG PO TABS
2.5000 mg | ORAL_TABLET | Freq: Every day | ORAL | 1 refills | Status: DC
Start: 2021-09-20 — End: 2022-02-16

## 2021-09-20 MED ORDER — PIOGLITAZONE HCL 30 MG PO TABS
30.0000 mg | ORAL_TABLET | Freq: Every day | ORAL | 1 refills | Status: DC
Start: 2021-09-20 — End: 2022-01-26

## 2021-09-20 MED ORDER — JANUMET 50-1000 MG PO TABS
1.0000 | ORAL_TABLET | Freq: Two times a day (BID) | ORAL | 1 refills | Status: DC
Start: 2021-09-20 — End: 2022-01-24

## 2021-09-20 MED ORDER — ROSUVASTATIN CALCIUM 5 MG PO TABS
5.0000 mg | ORAL_TABLET | Freq: Every day | ORAL | 1 refills | Status: DC
Start: 2021-09-20 — End: 2022-02-10

## 2021-09-20 MED ORDER — CITALOPRAM HYDROBROMIDE 10 MG PO TABS: 10.0000 mg | ORAL_TABLET | Freq: Every day | ORAL | 1 refills | Status: DC

## 2021-09-20 NOTE — Addendum Note (Signed)
Addended by: Marianne Sofia on: 09/20/2021 12:18 PM   Modules accepted: Orders

## 2021-09-20 NOTE — Progress Notes (Signed)
Subjective:  Patient ID: Katie White, female    DOB: [birth year]  Age: 56 y.o. MRN: [id number]  Chief Complaint  Patient presents with   Diabetes    HPI  Katie White has a history of type 2 Diabetes Mellitus fCurrent treatment includes janumet 50/1000mg  , actos 30mg   She denies hypoglycemic episodes. She states blood glucose levels at home fluctuate depending on diet and activity level  She  is consuming a heart healthy diet and exercising regularly. She performs diabetic foot checks daily after showering. She has scheduled for eye exam Pt is also on zestril 2.5mg  for kidneys  Mixed hyperlipidemia  Pt presents with hyperlipidemia. The patient is compliant with medications, maintains a low cholesterol diet , follows up as directed , and maintains an exercise regimen . The patient denies experiencing any hypercholesterolemia related symptoms. Currently on crestor 5mg  qd  Pt with history of vit D def - taking otc meds  Pt with history of anxiety - states celexa 10mg  qd works well for her  Pt would like to schedule mammogram - missed appt in the fall   Lab Results  Component Value Date   HGBA1C 8.1 (H) 11/26/2020   HGBA1C 9.4 (H) 05/24/2020   HGBA1C 10.3 (H) 02/22/2020    Current Outpatient Medications on File Prior to Visit  Medication Sig Dispense Refill   aspirin EC 81 MG tablet Take 81 mg by mouth daily. Swallow whole.     Multiple Vitamin (MULTIVITAMIN) tablet Take 1 tablet by mouth daily.     vitamin B-12 (CYANOCOBALAMIN) 100 MCG tablet Take 100 mcg by mouth daily.     Vitamin D, Cholecalciferol, 10 MCG (400 UNIT) CHEW Chew by mouth.     No current facility-administered medications on file prior to visit.   Past Medical History:  Diagnosis Date   Mixed hyperlipidemia 11/16/2019   Type 2 diabetes mellitus without complications (HCC) 11/16/2019   Past Surgical History:  Procedure Laterality Date   CESAREAN SECTION  09/1984    Family History  Problem Relation Age  of Onset   Other Mother        covid 80   Kidney failure Mother    Stroke Mother    Diabetes Mellitus II Mother    Heart attack Brother    Diabetes Mellitus II Brother    Social History   Socioeconomic History   Marital status: Single    Spouse name: Not on file   Number of children: 1   Years of education: Not on file   Highest education level: Not on file  Occupational History   Occupation: Instructor  Tobacco Use   Smoking status: Every Day    Packs/day: 0.50    Types: Cigarettes    Start date: 2020   Smokeless tobacco: Never  Vaping Use   Vaping Use: Never used  Substance and Sexual Activity   Alcohol use: Yes    Alcohol/week: 1.0 standard drink of alcohol    Types: 1 Glasses of wine per week    Comment: rarely   Drug use: Never   Sexual activity: Not Currently  Other Topics Concern   Not on file  Social History Narrative   Not on file   Social Determinants of Health   Financial Resource Strain: Not on file  Food Insecurity: Not on file  Transportation Needs: Not on file  Physical Activity: Not on file  Stress: Not on file  Social Connections: Not on file    Review of Systems CONSTITUTIONAL:  Negative for chills, fatigue, fever, unintentional weight gain and unintentional weight loss.  E/N/T: Negative for ear pain, nasal congestion and sore throat.  CARDIOVASCULAR: Negative for chest pain, dizziness, palpitations and pedal edema.  RESPIRATORY: Negative for recent cough and dyspnea.  GASTROINTESTINAL: Negative for abdominal pain, acid reflux symptoms, constipation, diarrhea, nausea and vomiting.  MSK: Negative for arthralgias and myalgias.  INTEGUMENTARY: Negative for rash.  NEUROLOGICAL: Negative for dizziness and headaches.  PSYCHIATRIC: Negative for sleep disturbance and to question depression screen.  Negative for depression, negative for anhedonia.       Objective:  PHYSICAL EXAM:   VS: BP 110/68 (BP Location: Right Arm, Patient Position:  Sitting, Cuff Size: Normal)   Pulse 72   Temp (!) 97 F (36.1 C) (Temporal)   Ht 5\' 7"  (1.702 m)   Wt 152 lb (68.9 kg)   SpO2 99%   BMI 23.81 kg/m   GEN: Well nourished, well developed, in no acute distress   Cardiac: RRR; no murmurs, rubs, or gallops,no edema -  Respiratory:  normal respiratory rate and pattern with no distress - normal breath sounds with no rales, rhonchi, wheezes or rubs  MS: no deformity or atrophy  Skin: warm and dry, no rash   Psych: euthymic mood, appropriate affect and demeanor  Diabetic Foot Exam - Simple   Simple Foot Form Visual Inspection No deformities, no ulcerations, no other skin breakdown bilaterally: Yes Sensation Testing Intact to touch and monofilament testing bilaterally: Yes Pulse Check Posterior Tibialis and Dorsalis pulse intact bilaterally: Yes Comments       Lab Results  Component Value Date   WBC 9.6 11/26/2020   HGB 11.6 11/26/2020   HCT 34.7 11/26/2020   PLT 220 11/26/2020   GLUCOSE 150 (H) 11/26/2020   CHOL 133 11/26/2020   TRIG 105 11/26/2020   HDL 49 11/26/2020   LDLCALC 65 11/26/2020   ALT 18 11/26/2020   AST 18 11/26/2020   NA 135 11/26/2020   K 4.1 11/26/2020   CL 99 11/26/2020   CREATININE 0.56 (L) 11/26/2020   BUN 9 11/26/2020   CO2 22 11/26/2020   TSH 1.420 11/26/2020   HGBA1C 8.1 (H) 11/26/2020   MICROALBUR 80 11/16/2019      Assessment & Plan:   Problem List Items Addressed This Visit       Other   Mixed hyperlipidemia   Relevant Orders   Lipid panel Continue meds Watch diet   Other Visit Diagnoses     Type 2 diabetes mellitus with hyperglycemia, without long-term current use of insulin (HCC)    -  Primary   Relevant Orders   CBC with Differential/Platelet   Comprehensive metabolic panel   TSH   Hemoglobin A1c Continue meds   Vitamin D insufficiency       Relevant Orders   VITAMIN D 25 Hydroxy (Vit-D Deficiency, Fractures)   Anxiety       Relevant Orders   TSH Continue meds      .  Meds ordered this encounter  Medications   citalopram (CELEXA) 10 MG tablet    Sig: Take 1 tablet (10 mg total) by mouth daily.    Dispense:  90 tablet    Refill:  1    Order Specific Question:   Supervising Provider    Answer:   01/16/2020   sitaGLIPtin-metformin (JANUMET) 50-1000 MG tablet    Sig: Take 1 tablet by mouth 2 (two) times daily with a meal.    Dispense:  180 tablet    Refill:  1    DX Code Needed  .    Order Specific Question:   Supervising Provider    Answer:   Blane Ohara [[id number]]   lisinopril (ZESTRIL) 2.5 MG tablet    Sig: Take 1 tablet (2.5 mg total) by mouth daily.    Dispense:  90 tablet    Refill:  1    Order Specific Question:   Supervising Provider    Answer:   Corey Harold   pioglitazone (ACTOS) 30 MG tablet    Sig: Take 1 tablet (30 mg total) by mouth daily.    Dispense:  90 tablet    Refill:  1    Order Specific Question:   Supervising Provider    Answer:   Corey Harold   rosuvastatin (CRESTOR) 5 MG tablet    Sig: Take 1 tablet (5 mg total) by mouth daily.    Dispense:  90 tablet    Refill:  1    Order Specific Question:   Supervising Provider    AnswerCorey Harold    Orders Placed This Encounter  Procedures   MM DIGITAL SCREENING BILATERAL   CBC with Differential/Platelet   Comprehensive metabolic panel   TSH   Lipid panel   Hemoglobin A1c   VITAMIN D 25 Hydroxy (Vit-D Deficiency, Fractures)     Follow-up: Return in about 4 months (around 01/21/2022) for chronic fasting follow up .  An After Visit Summary was printed and given to the patient.  Jettie Pagan [facility] [phone]

## 2021-09-21 LAB — COMPREHENSIVE METABOLIC PANEL
ALT: 10 IU/L (ref 0–32)
AST: 15 IU/L (ref 0–40)
Albumin/Globulin Ratio: 1.7 (ref 1.2–2.2)
Albumin: 4.5 g/dL (ref 3.8–4.9)
Alkaline Phosphatase: 64 IU/L (ref 44–121)
BUN/Creatinine Ratio: 18 (ref 9–23)
BUN: 13 mg/dL (ref 6–24)
Bilirubin Total: 0.2 mg/dL (ref 0.0–1.2)
CO2: 22 mmol/L (ref 20–29)
Calcium: 9.5 mg/dL (ref 8.7–10.2)
Chloride: 101 mmol/L (ref 96–106)
Creatinine, Ser: 0.73 mg/dL (ref 0.57–1.00)
Globulin, Total: 2.7 g/dL (ref 1.5–4.5)
Glucose: 202 mg/dL — ABNORMAL HIGH (ref 70–99)
Potassium: 4.6 mmol/L (ref 3.5–5.2)
Sodium: 136 mmol/L (ref 134–144)
Total Protein: 7.2 g/dL (ref 6.0–8.5)
eGFR: 98 mL/min/{1.73_m2} (ref >59)

## 2021-09-21 LAB — CBC WITH DIFFERENTIAL/PLATELET
Basophils Absolute: 0 10*3/uL (ref 0.0–0.2)
Basos: 0 %
EOS (ABSOLUTE): 0.1 10*3/uL (ref 0.0–0.4)
Eos: 1 %
Hematocrit: 33.7 % — ABNORMAL LOW (ref 34.0–46.6)
Hemoglobin: 11 g/dL — ABNORMAL LOW (ref 11.1–15.9)
Immature Grans (Abs): 0 10*3/uL (ref 0.0–0.1)
Immature Granulocytes: 0 %
Lymphocytes Absolute: 3.3 10*3/uL — ABNORMAL HIGH (ref 0.7–3.1)
Lymphs: 38 %
MCH: 31.9 pg (ref 26.6–33.0)
MCHC: 32.6 g/dL (ref 31.5–35.7)
MCV: 98 fL — ABNORMAL HIGH (ref 79–97)
Monocytes Absolute: 0.7 10*3/uL (ref 0.1–0.9)
Monocytes: 8 %
Neutrophils Absolute: 4.5 10*3/uL (ref 1.4–7.0)
Neutrophils: 53 %
Platelets: 230 10*3/uL (ref 150–450)
RBC: 3.45 x10E6/uL — ABNORMAL LOW (ref 3.77–5.28)
RDW: 13.2 % (ref 11.7–15.4)
WBC: 8.6 10*3/uL (ref 3.4–10.8)

## 2021-09-21 LAB — HEMOGLOBIN A1C
Est. average glucose Bld gHb Est-mCnc: 166 mg/dL
Hgb A1c MFr Bld: 7.4 % — ABNORMAL HIGH (ref 4.8–5.6)

## 2021-09-21 LAB — LIPID PANEL
Chol/HDL Ratio: 3.2 ratio (ref 0.0–4.4)
Cholesterol, Total: 179 mg/dL (ref 100–199)
HDL: 56 mg/dL (ref >39)
LDL Chol Calc (NIH): 111 mg/dL — ABNORMAL HIGH (ref 0–99)
Triglycerides: 62 mg/dL (ref 0–149)
VLDL Cholesterol Cal: 12 mg/dL (ref 5–40)

## 2021-09-21 LAB — MICROALBUMIN / CREATININE URINE RATIO
Creatinine, Urine: 82.6 mg/dL
Microalb/Creat Ratio: <4 mg/g creat (ref 0–29)
Microalbumin, Urine: <3 ug/mL

## 2021-09-21 LAB — CARDIOVASCULAR RISK ASSESSMENT

## 2021-09-21 LAB — VITAMIN D 25 HYDROXY (VIT D DEFICIENCY, FRACTURES): Vit D, 25-Hydroxy: 32.3 ng/mL (ref 30.0–100.0)

## 2021-09-21 LAB — TSH: TSH: 1.69 u[IU]/mL (ref 0.450–4.500)

## 2021-09-23 ENCOUNTER — Other Ambulatory Visit: Payer: Self-pay | Admitting: Physician Assistant

## 2021-09-23 DIAGNOSIS — R899 Unspecified abnormal finding in specimens from other organs, systems and tissues: Secondary | ICD-10-CM

## 2021-10-14 ENCOUNTER — Ambulatory Visit
Admission: RE | Admit: 2021-10-14 | Discharge: 2021-10-14 | Disposition: A | Discharge status: Home / Self Care | Payer: BC Managed Care – PPO | Source: Ambulatory Visit | Attending: Physician Assistant | Admitting: Physician Assistant

## 2021-10-14 DIAGNOSIS — Z1231 Encounter for screening mammogram for malignant neoplasm of breast: Secondary | ICD-10-CM | POA: Diagnosis not present

## 2021-10-21 ENCOUNTER — Other Ambulatory Visit: Payer: BC Managed Care – PPO

## 2021-10-21 DIAGNOSIS — R899 Unspecified abnormal finding in specimens from other organs, systems and tissues: Secondary | ICD-10-CM | POA: Diagnosis not present

## 2021-10-21 LAB — CBC WITH DIFFERENTIAL/PLATELET
Basophils Absolute: 0 10*3/uL (ref 0.0–0.2)
Basos: 0 %
EOS (ABSOLUTE): 0.1 10*3/uL (ref 0.0–0.4)
Eos: 1 %
Hematocrit: 32.2 % — ABNORMAL LOW (ref 34.0–46.6)
Hemoglobin: 11 g/dL — ABNORMAL LOW (ref 11.1–15.9)
Immature Grans (Abs): 0 10*3/uL (ref 0.0–0.1)
Immature Granulocytes: 0 %
Lymphocytes Absolute: 3.1 10*3/uL (ref 0.7–3.1)
Lymphs: 35 %
MCH: 33.1 pg — ABNORMAL HIGH (ref 26.6–33.0)
MCHC: 34.2 g/dL (ref 31.5–35.7)
MCV: 97 fL (ref 79–97)
Monocytes Absolute: 0.6 10*3/uL (ref 0.1–0.9)
Monocytes: 6 %
Neutrophils Absolute: 5.2 10*3/uL (ref 1.4–7.0)
Neutrophils: 58 %
Platelets: 232 10*3/uL (ref 150–450)
RBC: 3.32 x10E6/uL — ABNORMAL LOW (ref 3.77–5.28)
RDW: 13.1 % (ref 11.7–15.4)
WBC: 9 10*3/uL (ref 3.4–10.8)

## 2021-10-23 ENCOUNTER — Telehealth: Payer: Self-pay

## 2021-10-23 ENCOUNTER — Other Ambulatory Visit: Payer: Self-pay

## 2021-10-23 DIAGNOSIS — R899 Unspecified abnormal finding in specimens from other organs, systems and tissues: Secondary | ICD-10-CM

## 2021-10-23 NOTE — Telephone Encounter (Signed)
Attempted patient, no answer. Left VM for return call to schedule.   Lorita Officer, [place] 10/23/21 4:02 PM

## 2021-10-23 NOTE — Telephone Encounter (Signed)
Attempted to call patient, left detailed VM. Patient is to return call to schedule lab appointment for b12/folate lab.   Order placed.  Lorita Officer, [facility] 10/23/21 11:40 AM

## 2021-10-24 ENCOUNTER — Other Ambulatory Visit: Payer: BC Managed Care – PPO

## 2021-11-22 DIAGNOSIS — E113293 Type 2 diabetes mellitus with mild nonproliferative diabetic retinopathy without macular edema, bilateral: Secondary | ICD-10-CM | POA: Diagnosis not present

## 2021-11-22 LAB — HM DIABETES EYE EXAM

## 2021-12-05 ENCOUNTER — Encounter: Payer: Self-pay | Admitting: Physician Assistant

## 2022-01-24 ENCOUNTER — Telehealth: Payer: Self-pay

## 2022-01-24 ENCOUNTER — Ambulatory Visit: Payer: BC Managed Care – PPO | Admitting: Physician Assistant

## 2022-01-24 ENCOUNTER — Encounter: Payer: Self-pay | Admitting: Physician Assistant

## 2022-01-24 ENCOUNTER — Other Ambulatory Visit: Payer: Self-pay | Admitting: Physician Assistant

## 2022-01-24 VITALS — BP 128/70 | HR 74 | Temp 97.4°F | Ht 65.0 in | Wt 155.8 lb

## 2022-01-24 DIAGNOSIS — E1165 Type 2 diabetes mellitus with hyperglycemia: Secondary | ICD-10-CM

## 2022-01-24 DIAGNOSIS — F419 Anxiety disorder, unspecified: Secondary | ICD-10-CM | POA: Diagnosis not present

## 2022-01-24 DIAGNOSIS — Z23 Encounter for immunization: Secondary | ICD-10-CM | POA: Diagnosis not present

## 2022-01-24 DIAGNOSIS — Z720 Tobacco use: Secondary | ICD-10-CM

## 2022-01-24 DIAGNOSIS — E782 Mixed hyperlipidemia: Secondary | ICD-10-CM | POA: Diagnosis not present

## 2022-01-24 DIAGNOSIS — E559 Vitamin D deficiency, unspecified: Secondary | ICD-10-CM

## 2022-01-24 DIAGNOSIS — Z8639 Personal history of other endocrine, nutritional and metabolic disease: Secondary | ICD-10-CM | POA: Diagnosis not present

## 2022-01-24 DIAGNOSIS — E119 Type 2 diabetes mellitus without complications: Secondary | ICD-10-CM

## 2022-01-24 MED ORDER — JANUMET 50-1000 MG PO TABS: 1.0000 | ORAL_TABLET | Freq: Two times a day (BID) | ORAL | 1 refills | Status: DC

## 2022-01-24 MED ORDER — VARENICLINE TARTRATE (STARTER) 0.5 MG X 11 & 1 MG X 42 PO TBPK: ORAL_TABLET | ORAL | 0 refills | Status: AC

## 2022-01-24 NOTE — Telephone Encounter (Signed)
Patient wants to know can she have samples of janumet, her pharmacy is out of stock she just needs some for a few days until she can get them   Per Marianne Sofia: we dont have any right now - have her call other pharmacies and will send there   Patient would like RX sent to [facility] on [address].  RX sent.

## 2022-01-24 NOTE — Progress Notes (Signed)
Subjective:  Patient ID: Katie White, female    DOB: 07-13-1965  Age: 56 y.o. MRN: [id number]  Chief Complaint  Patient presents with   Diabetes    HPI  Katie White has a history of type 2 Diabetes Mellitus for several years. Current treatment includesactos30mg  and janumet 50/1000mg   She denies hypoglycemic episodes. She states blood glucose levels at home range from 90 to 120. She  is consuming a heart healthy diet and exercising regularly. She performs diabetic foot checks daily after showering. She is up to date on eye exam She is on ACE inhibitor and statin  Pt with history of anxiety - currently stable on celexa 10mg  qd  Mixed hyperlipidemia  Pt presents with hyperlipidemia.  Compliance with treatment has been good -The patient is compliant with medications, maintains a low cholesterol diet , follows up as directed , and maintains an exercise regimen . The patient denies experiencing any hypercholesterolemia related symptoms. Currently on cresto 5mg  qd  Pt states that she has had some mild fatigue.  She does have history of iron def anemia and has restarted daily supplement  Pt would like flu vaccine and COVID vaccine today  Pt states she is smoking about 3/4 ppd cigarettes and would like rx for chantix to help her stop smoking   Lab Results  Component Value Date   HGBA1C 7.4 (H) 09/20/2021   HGBA1C 8.1 (H) 11/26/2020   HGBA1C 9.4 (H) 05/24/2020    Current Outpatient Medications on File Prior to Visit  Medication Sig Dispense Refill   aspirin EC 81 MG tablet Take 81 mg by mouth daily. Swallow whole.     citalopram (CELEXA) 10 MG tablet Take 1 tablet (10 mg total) by mouth daily. 90 tablet 1   ferrous sulfate 325 (65 FE) MG EC tablet Take 325 mg by mouth daily with breakfast.     lisinopril (ZESTRIL) 2.5 MG tablet Take 1 tablet (2.5 mg total) by mouth daily. 90 tablet 1   pioglitazone (ACTOS) 30 MG tablet Take 1 tablet (30 mg total) by mouth daily. 90 tablet 1    rosuvastatin (CRESTOR) 5 MG tablet Take 1 tablet (5 mg total) by mouth daily. 90 tablet 1   No current facility-administered medications on file prior to visit.   Past Medical History:  Diagnosis Date   Mixed hyperlipidemia 11/16/2019   Type 2 diabetes mellitus without complications (HCC) 11/16/2019   Past Surgical History:  Procedure Laterality Date   CESAREAN SECTION  09/1984    Family History  Problem Relation Age of Onset   Other Mother        covid 73   Kidney failure Mother    Stroke Mother    Diabetes Mellitus II Mother    Heart attack Brother    Diabetes Mellitus II Brother    Social History   Socioeconomic History   Marital status: Single    Spouse name: Not on file   Number of children: 1   Years of education: Not on file   Highest education level: Not on file  Occupational History   Occupation: Instructor  Tobacco Use   Smoking status: Every Day    Packs/day: 0.50    Types: Cigarettes    Start date: 2020   Smokeless tobacco: Never  Vaping Use   Vaping Use: Never used  Substance and Sexual Activity   Alcohol use: Yes    Alcohol/week: 1.0 standard drink of alcohol    Types: 1 Glasses of wine per  week    Comment: rarely   Drug use: Never   Sexual activity: Not Currently  Other Topics Concern   Not on file  Social History Narrative   Not on file   Social Determinants of Health   Financial Resource Strain: Not on file  Food Insecurity: Not on file  Transportation Needs: Not on file  Physical Activity: Not on file  Stress: Not on file  Social Connections: Not on file    Review of Systems  CONSTITUTIONAL: Negative for chills, fatigue, fever, unintentional weight gain and unintentional weight loss.  E/N/T: Negative for ear pain, nasal congestion and sore throat.  CARDIOVASCULAR: Negative for chest pain, dizziness, palpitations and pedal edema.  RESPIRATORY: Negative for recent cough and dyspnea.  GASTROINTESTINAL: Negative for abdominal pain, acid  reflux symptoms, constipation, diarrhea, nausea and vomiting.  MSK: Negative for arthralgias and myalgias.  INTEGUMENTARY: Negative for rash.  NEUROLOGICAL: Negative for dizziness and headaches.  PSYCHIATRIC: Negative for sleep disturbance and to question depression screen.  Negative for depression, negative for anhedonia.      Objective:  PHYSICAL EXAM:   VS: BP 128/70   Pulse 74   Temp (!) 97.4 F (36.3 C)   Ht 5\' 5"  (1.651 m)   Wt 155 lb 12.8 oz (70.7 kg)   SpO2 100%   BMI 25.93 kg/m   GEN: Well nourished, well developed, in no acute distress  Cardiac: RRR; no murmurs, rubs, or gallops,no edema -  Respiratory:  normal respiratory rate and pattern with no distress - normal breath sounds with no rales, rhonchi, wheezes or rubs MS: no deformity or atrophy  Skin: warm and dry, no rash  Psych: euthymic mood, appropriate affect and demeanor   Lab Results  Component Value Date   WBC 9.0 10/21/2021   HGB 11.0 (L) 10/21/2021   HCT 32.2 (L) 10/21/2021   PLT 232 10/21/2021   GLUCOSE 202 (H) 09/20/2021   CHOL 179 09/20/2021   TRIG 62 09/20/2021   HDL 56 09/20/2021   LDLCALC 111 (H) 09/20/2021   ALT 10 09/20/2021   AST 15 09/20/2021   NA 136 09/20/2021   K 4.6 09/20/2021   CL 101 09/20/2021   CREATININE 0.73 09/20/2021   BUN 13 09/20/2021   CO2 22 09/20/2021   TSH 1.690 09/20/2021   HGBA1C 7.4 (H) 09/20/2021   MICROALBUR 80 11/16/2019      Assessment & Plan:   Problem List Items Addressed This Visit       Other   Mixed hyperlipidemia   Relevant Orders   CBC with Differential/Platelet   Comprehensive metabolic panel   Lipid panel Conitnue crestor Low fat diet   Tobacco abuse   Relevant Medications   Varenicline Tartrate, Starter, (CHANTIX STARTING MONTH PAK) 0.5 MG X 11 & 1 MG X 42 TBPK   Other Visit Diagnoses     Type 2 diabetes mellitus with hyperglycemia, without long-term current use of insulin (HCC)    -  Primary   Relevant Orders   CBC with  Differential/Platelet   Comprehensive metabolic panel   TSH   Hemoglobin A1c Watch diet and continue meds   Vitamin D insufficiency       Relevant Orders   VITAMIN D 25 Hydroxy (Vit-D Deficiency, Fractures)   Anxiety     Continue celexa   History of iron deficiency       Relevant Orders   Iron, TIBC and Ferritin Panel   Needs flu shot  Relevant Orders   Flu Vaccine MDCK QUAD PF   Need for COVID-19 vaccine       Relevant Orders   [employer] Fall 2023 Covid-19 Vaccine 52yrs and older                 .  Meds ordered this encounter  Medications   Varenicline Tartrate, Starter, (CHANTIX STARTING MONTH PAK) 0.5 MG X 11 & 1 MG X 42 TBPK    Sig: Take 0.5 mg by mouth daily for 3 days, THEN 0.5 mg 2 (two) times daily for 4 days, THEN 1 mg 2 (two) times daily for 21 days.    Dispense:  1 each    Refill:  0    Order Specific Question:   Supervising Provider    AnswerCorey Harold    Orders Placed This Encounter  Procedures   Flu Vaccine MDCK QUAD PF   Pfizer Fall 2023 Covid-19 Vaccine 66yrs and older   Flu Vaccine MDCK QUAD PF   Pfizer Fall 2023 Covid-19 Vaccine 53yrs and older   CBC with Differential/Platelet   Comprehensive metabolic panel   TSH   Lipid panel   Hemoglobin A1c   VITAMIN D 25 Hydroxy (Vit-D Deficiency, Fractures)   Iron, TIBC and Ferritin Panel     Follow-up: Return in about 3 months (around 04/26/2022) for fasting physical with pap -- 40 min.  An After Visit Summary was printed and given to the patient.  Jettie Pagan [facility] [phone]

## 2022-01-25 LAB — CBC WITH DIFFERENTIAL/PLATELET
Basophils Absolute: 0 10*3/uL (ref 0.0–0.2)
Basos: 0 %
EOS (ABSOLUTE): 0.1 10*3/uL (ref 0.0–0.4)
Eos: 1 %
Hematocrit: 32.4 % — ABNORMAL LOW (ref 34.0–46.6)
Hemoglobin: 10.6 g/dL — ABNORMAL LOW (ref 11.1–15.9)
Immature Grans (Abs): 0 10*3/uL (ref 0.0–0.1)
Immature Granulocytes: 0 %
Lymphocytes Absolute: 3.1 10*3/uL (ref 0.7–3.1)
Lymphs: 35 %
MCH: 32.2 pg (ref 26.6–33.0)
MCHC: 32.7 g/dL (ref 31.5–35.7)
MCV: 99 fL — ABNORMAL HIGH (ref 79–97)
Monocytes Absolute: 0.8 10*3/uL (ref 0.1–0.9)
Monocytes: 9 %
Neutrophils Absolute: 5 10*3/uL (ref 1.4–7.0)
Neutrophils: 55 %
Platelets: 211 10*3/uL (ref 150–450)
RBC: 3.29 x10E6/uL — ABNORMAL LOW (ref 3.77–5.28)
RDW: 13.8 % (ref 11.7–15.4)
WBC: 9.1 10*3/uL (ref 3.4–10.8)

## 2022-01-25 LAB — COMPREHENSIVE METABOLIC PANEL
ALT: 15 IU/L (ref 0–32)
AST: 14 IU/L (ref 0–40)
Albumin/Globulin Ratio: 1.6 (ref 1.2–2.2)
Albumin: 4.5 g/dL (ref 3.8–4.9)
Alkaline Phosphatase: 59 IU/L (ref 44–121)
BUN/Creatinine Ratio: 19 (ref 9–23)
BUN: 13 mg/dL (ref 6–24)
Bilirubin Total: 0.2 mg/dL (ref 0.0–1.2)
CO2: 23 mmol/L (ref 20–29)
Calcium: 9.4 mg/dL (ref 8.7–10.2)
Chloride: 101 mmol/L (ref 96–106)
Creatinine, Ser: 0.68 mg/dL (ref 0.57–1.00)
Globulin, Total: 2.8 g/dL (ref 1.5–4.5)
Glucose: 208 mg/dL — ABNORMAL HIGH (ref 70–99)
Potassium: 4.4 mmol/L (ref 3.5–5.2)
Sodium: 138 mmol/L (ref 134–144)
Total Protein: 7.3 g/dL (ref 6.0–8.5)
eGFR: 103 mL/min/{1.73_m2} (ref >59)

## 2022-01-25 LAB — LIPID PANEL
Chol/HDL Ratio: 2.3 ratio (ref 0.0–4.4)
Cholesterol, Total: 140 mg/dL (ref 100–199)
HDL: 62 mg/dL (ref >39)
LDL Chol Calc (NIH): 64 mg/dL (ref 0–99)
Triglycerides: 67 mg/dL (ref 0–149)
VLDL Cholesterol Cal: 14 mg/dL (ref 5–40)

## 2022-01-25 LAB — CARDIOVASCULAR RISK ASSESSMENT

## 2022-01-25 LAB — IRON,TIBC AND FERRITIN PANEL
Ferritin: 86 ng/mL (ref 15–150)
Iron Saturation: 23 % (ref 15–55)
Iron: 74 ug/dL (ref 27–159)
Total Iron Binding Capacity: 328 ug/dL (ref 250–450)
UIBC: 254 ug/dL (ref 131–425)

## 2022-01-25 LAB — VITAMIN D 25 HYDROXY (VIT D DEFICIENCY, FRACTURES): Vit D, 25-Hydroxy: 19.2 ng/mL — ABNORMAL LOW (ref 30.0–100.0)

## 2022-01-25 LAB — TSH: TSH: 1.53 u[IU]/mL (ref 0.450–4.500)

## 2022-01-25 LAB — HEMOGLOBIN A1C
Est. average glucose Bld gHb Est-mCnc: 171 mg/dL
Hgb A1c MFr Bld: 7.6 % — ABNORMAL HIGH (ref 4.8–5.6)

## 2022-01-26 ENCOUNTER — Other Ambulatory Visit: Payer: Self-pay | Admitting: Physician Assistant

## 2022-01-26 DIAGNOSIS — E559 Vitamin D deficiency, unspecified: Secondary | ICD-10-CM

## 2022-01-26 DIAGNOSIS — R899 Unspecified abnormal finding in specimens from other organs, systems and tissues: Secondary | ICD-10-CM

## 2022-01-26 DIAGNOSIS — E1165 Type 2 diabetes mellitus with hyperglycemia: Secondary | ICD-10-CM

## 2022-01-26 MED ORDER — VITAMIN D (ERGOCALCIFEROL) 1.25 MG (50000 UNIT) PO CAPS: 50000.0000 [IU] | ORAL_CAPSULE | ORAL | 5 refills | Status: DC

## 2022-01-26 MED ORDER — PIOGLITAZONE HCL 45 MG PO TABS: 45.0000 mg | ORAL_TABLET | Freq: Every day | ORAL | 1 refills | Status: DC

## 2022-02-08 ENCOUNTER — Other Ambulatory Visit: Payer: Self-pay | Admitting: Physician Assistant

## 2022-02-08 DIAGNOSIS — F419 Anxiety disorder, unspecified: Secondary | ICD-10-CM

## 2022-02-08 DIAGNOSIS — E119 Type 2 diabetes mellitus without complications: Secondary | ICD-10-CM

## 2022-02-10 ENCOUNTER — Other Ambulatory Visit: Payer: Self-pay | Admitting: Physician Assistant

## 2022-02-10 DIAGNOSIS — E1165 Type 2 diabetes mellitus with hyperglycemia: Secondary | ICD-10-CM

## 2022-02-10 MED ORDER — PIOGLITAZONE HCL 45 MG PO TABS: 45.0000 mg | ORAL_TABLET | Freq: Every day | ORAL | 1 refills | Status: DC

## 2022-02-15 ENCOUNTER — Other Ambulatory Visit: Payer: Self-pay | Admitting: Physician Assistant

## 2022-02-15 DIAGNOSIS — E119 Type 2 diabetes mellitus without complications: Secondary | ICD-10-CM

## 2022-02-24 ENCOUNTER — Other Ambulatory Visit: Payer: Self-pay | Admitting: Physician Assistant

## 2022-02-24 DIAGNOSIS — Z720 Tobacco use: Secondary | ICD-10-CM

## 2022-02-24 DIAGNOSIS — E119 Type 2 diabetes mellitus without complications: Secondary | ICD-10-CM

## 2022-02-24 DIAGNOSIS — E1165 Type 2 diabetes mellitus with hyperglycemia: Secondary | ICD-10-CM

## 2022-02-24 MED ORDER — VARENICLINE TARTRATE 1 MG PO TABS: 1.0000 mg | ORAL_TABLET | Freq: Two times a day (BID) | ORAL | 1 refills | Status: DC

## 2022-02-24 MED ORDER — PIOGLITAZONE HCL 45 MG PO TABS: 45.0000 mg | ORAL_TABLET | Freq: Every day | ORAL | 1 refills | Status: DC

## 2022-02-28 ENCOUNTER — Other Ambulatory Visit: Payer: BC Managed Care – PPO

## 2022-03-11 ENCOUNTER — Other Ambulatory Visit: Payer: Self-pay | Admitting: Physician Assistant

## 2022-03-11 DIAGNOSIS — E119 Type 2 diabetes mellitus without complications: Secondary | ICD-10-CM

## 2022-03-18 ENCOUNTER — Other Ambulatory Visit: Payer: Self-pay | Admitting: Physician Assistant

## 2022-05-13 ENCOUNTER — Encounter: Payer: BC Managed Care – PPO | Admitting: Physician Assistant

## 2022-06-21 ENCOUNTER — Other Ambulatory Visit: Payer: Self-pay | Admitting: Physician Assistant

## 2022-06-21 DIAGNOSIS — E119 Type 2 diabetes mellitus without complications: Secondary | ICD-10-CM

## 2022-08-08 ENCOUNTER — Other Ambulatory Visit: Payer: Self-pay | Admitting: Physician Assistant

## 2022-08-08 DIAGNOSIS — E119 Type 2 diabetes mellitus without complications: Secondary | ICD-10-CM

## 2022-08-08 DIAGNOSIS — F419 Anxiety disorder, unspecified: Secondary | ICD-10-CM

## 2022-08-08 NOTE — Telephone Encounter (Signed)
Have pt schedule fasting follow up then will fill medication

## 2022-08-08 NOTE — Telephone Encounter (Signed)
Unable to reach patient.

## 2022-08-12 ENCOUNTER — Other Ambulatory Visit: Payer: Self-pay | Admitting: Physician Assistant

## 2022-08-12 DIAGNOSIS — E119 Type 2 diabetes mellitus without complications: Secondary | ICD-10-CM

## 2022-08-12 DIAGNOSIS — F419 Anxiety disorder, unspecified: Secondary | ICD-10-CM

## 2022-08-12 MED ORDER — CITALOPRAM HYDROBROMIDE 10 MG PO TABS
10.0000 mg | ORAL_TABLET | Freq: Every day | ORAL | 0 refills | Status: DC
Start: 2022-08-12 — End: 2022-11-03

## 2022-08-12 MED ORDER — ROSUVASTATIN CALCIUM 5 MG PO TABS: 5.0000 mg | ORAL_TABLET | Freq: Every day | ORAL | 0 refills | Status: DC

## 2022-08-12 NOTE — Telephone Encounter (Signed)
Appointment has been made for 08/19/22 at 11:20 AM

## 2022-08-19 ENCOUNTER — Ambulatory Visit: Payer: BC Managed Care – PPO | Admitting: Physician Assistant

## 2022-08-19 ENCOUNTER — Encounter: Payer: Self-pay | Admitting: Physician Assistant

## 2022-08-19 VITALS — BP 122/70 | HR 68 | Temp 96.9°F | Ht 65.0 in | Wt 175.4 lb

## 2022-08-19 DIAGNOSIS — E1165 Type 2 diabetes mellitus with hyperglycemia: Secondary | ICD-10-CM | POA: Diagnosis not present

## 2022-08-19 DIAGNOSIS — E559 Vitamin D deficiency, unspecified: Secondary | ICD-10-CM | POA: Diagnosis not present

## 2022-08-19 DIAGNOSIS — E782 Mixed hyperlipidemia: Secondary | ICD-10-CM | POA: Diagnosis not present

## 2022-08-19 DIAGNOSIS — E119 Type 2 diabetes mellitus without complications: Secondary | ICD-10-CM

## 2022-08-19 MED ORDER — LISINOPRIL 2.5 MG PO TABS: 2.5000 mg | ORAL_TABLET | Freq: Every day | ORAL | 1 refills | Status: DC

## 2022-08-19 NOTE — Progress Notes (Signed)
Subjective:  Patient ID: Katie White, female    DOB: 1965-06-25  Age: 57 y.o. MRN: [id number]  Chief Complaint  Patient presents with   Medical Management of Chronic Issues         Katie White has a history of type 2 Diabetes Mellitus for several years. Current treatment includesactos 30mg  and janumet 50/1000mg   She denies hypoglycemic episodes. She states blood glucose levels at home range from 90 to 120. She  is consuming a heart healthy diet and exercising regularly. She performs diabetic foot checks daily after showering. She is up to date on eye exam She is on ACE inhibitor and statin  Pt with history of anxiety - currently stable on celexa 10mg  qd  Mixed hyperlipidemia  Pt presents with hyperlipidemia.  Compliance with treatment has been good -The patient is compliant with medications, maintains a low cholesterol diet , follows up as directed , and maintains an exercise regimen . The patient denies experiencing any hypercholesterolemia related symptoms. Currently on crestor 5mg  qd    Lab Results  Component Value Date   HGBA1C 7.6 (H) 01/24/2022   HGBA1C 7.4 (H) 09/20/2021   HGBA1C 8.1 (H) 11/26/2020    Current Outpatient Medications on File Prior to Visit  Medication Sig Dispense Refill   aspirin EC 81 MG tablet Take 81 mg by mouth daily. Swallow whole.     citalopram (CELEXA) 10 MG tablet Take 1 tablet (10 mg total) by mouth daily. 90 tablet 0   pioglitazone (ACTOS) 45 MG tablet Take 1 tablet (45 mg total) by mouth daily. 90 tablet 1   rosuvastatin (CRESTOR) 5 MG tablet Take 1 tablet (5 mg total) by mouth daily. 90 tablet 0   sitaGLIPtin-metformin (JANUMET) 50-1000 MG tablet Take 1 tablet by mouth 2 (two) times daily with a meal. 180 tablet 1   Vitamin D, Ergocalciferol, (DRISDOL) 1.25 MG (50000 UNIT) CAPS capsule Take 1 capsule (50,000 Units total) by mouth every 7 (seven) days. 5 capsule 5   No current facility-administered medications on file prior to visit.    Past Medical History:  Diagnosis Date   Mixed hyperlipidemia 11/16/2019   Type 2 diabetes mellitus without complications (HCC) 11/16/2019   Past Surgical History:  Procedure Laterality Date   CESAREAN SECTION  09/1984    Family History  Problem Relation Age of Onset   Other Mother        covid 62   Kidney failure Mother    Stroke Mother    Diabetes Mellitus II Mother    Heart attack Brother    Diabetes Mellitus II Brother    Social History   Socioeconomic History   Marital status: Single    Spouse name: Not on file   Number of children: 1   Years of education: Not on file   Highest education level: Not on file  Occupational History   Occupation: Instructor  Tobacco Use   Smoking status: Former    Packs/day: .5    Types: Cigarettes    Start date: 2020    Quit date: 03/17/2022    Years since quitting: 0.4   Smokeless tobacco: Never  Vaping Use   Vaping Use: Never used  Substance and Sexual Activity   Alcohol use: Yes    Alcohol/week: 1.0 standard drink of alcohol    Types: 1 Glasses of wine per week    Comment: rarely   Drug use: Never   Sexual activity: Not Currently  Other Topics Concern   Not  on file  Social History Narrative   Not on file   Social Determinants of Health   Financial Resource Strain: Low Risk  (08/19/2022)   Overall Financial Resource Strain (CARDIA)    Difficulty of Paying Living Expenses: Not hard at all  Food Insecurity: No Food Insecurity (08/19/2022)   Hunger Vital Sign    Worried About Running Out of Food in the Last Year: Never true    Ran Out of Food in the Last Year: Never true  Transportation Needs: No Transportation Needs (08/19/2022)   PRAPARE - Administrator, Civil Service (Medical): No    Lack of Transportation (Non-Medical): No  Physical Activity: Insufficiently Active (08/19/2022)   Exercise Vital Sign    Days of Exercise per Week: 3 days    Minutes of Exercise per Session: 30 min  Stress: No Stress Concern Present  (08/19/2022)   [employer] of Occupational Health - Occupational Stress Questionnaire    Feeling of Stress : Not at all  Social Connections: Moderately Isolated (08/19/2022)   Social Connection and Isolation Panel [NHANES]    Frequency of Communication with Friends and Family: More than three times a week    Frequency of Social Gatherings with Friends and Family: Three times a week    Attends Religious Services: More than 4 times per year    Active Member of Clubs or Organizations: No    Attends Banker Meetings: Never    Marital Status: Never married   CONSTITUTIONAL: Negative for chills, fatigue, fever, unintentional weight gain and unintentional weight loss.  E/N/T: Negative for ear pain, nasal congestion and sore throat.  CARDIOVASCULAR: Negative for chest pain, dizziness, palpitations and pedal edema.  RESPIRATORY: Negative for recent cough and dyspnea.  GASTROINTESTINAL: Negative for abdominal pain, acid reflux symptoms, constipation, diarrhea, nausea and vomiting.  MSK: Negative for arthralgias and myalgias.  INTEGUMENTARY: Negative for rash.  NEUROLOGICAL: Negative for dizziness and headaches.  PSYCHIATRIC: Negative for sleep disturbance and to question depression screen.  Negative for depression, negative for anhedonia.       Objective:  PHYSICAL EXAM:   VS: BP 122/70 (BP Location: Left Arm, Patient Position: Sitting, Cuff Size: Normal)   Pulse 68   Temp (!) 96.9 F (36.1 C) (Temporal)   Ht 5\' 5"  (1.651 m)   Wt 175 lb 6.4 oz (79.6 kg)   SpO2 98%   BMI 29.19 kg/m   GEN: Well nourished, well developed, in no acute distress   Cardiac: RRR; no murmurs, rubs, or gallops,no edema -  Respiratory:  normal respiratory rate and pattern with no distress - normal breath sounds with no rales, rhonchi, wheezes or rubs MS: no deformity or atrophy  Skin: warm and dry, no rash   Psych: euthymic mood, appropriate affect and demeanor    Lab Results  Component  Value Date   WBC 9.1 01/24/2022   HGB 10.6 (L) 01/24/2022   HCT 32.4 (L) 01/24/2022   PLT 211 01/24/2022   GLUCOSE 208 (H) 01/24/2022   CHOL 140 01/24/2022   TRIG 67 01/24/2022   HDL 62 01/24/2022   LDLCALC 64 01/24/2022   ALT 15 01/24/2022   AST 14 01/24/2022   NA 138 01/24/2022   K 4.4 01/24/2022   CL 101 01/24/2022   CREATININE 0.68 01/24/2022   BUN 13 01/24/2022   CO2 23 01/24/2022   TSH 1.530 01/24/2022   HGBA1C 7.6 (H) 01/24/2022   MICROALBUR 80 11/16/2019      Assessment &  Plan:   Problem List Items Addressed This Visit       Other   Mixed hyperlipidemia   Relevant Orders   CBC with Differential/Platelet   Comprehensive metabolic panel   Lipid panel Conitnue crestor Low fat diet            Other Visit Diagnoses     Type 2 diabetes mellitus with hyperglycemia, without long-term current use of insulin (HCC)    -  Primary   Relevant Orders   CBC with Differential/Platelet   Comprehensive metabolic panel   TSH   Hemoglobin A1c Watch diet and continue meds   Vitamin D insufficiency       Relevant Orders   VITAMIN D 25 Hydroxy (Vit-D Deficiency, Fractures)   Anxiety     Continue celexa                                            .  Meds ordered this encounter  Medications   lisinopril (ZESTRIL) 2.5 MG tablet    Sig: Take 1 tablet (2.5 mg total) by mouth daily.    Dispense:  90 tablet    Refill:  1    Order Specific Question:   Supervising Provider    AnswerCorey Harold    Orders Placed This Encounter  Procedures   CBC with Differential/Platelet   Comprehensive metabolic panel   TSH   Lipid panel   Hemoglobin A1c   VITAMIN D 25 Hydroxy (Vit-D Deficiency, Fractures)     Follow-up: Return for pt scheduled in September for pap.  An After Visit Summary was printed and given to the patient.  Jettie Pagan [facility] [phone]

## 2022-08-20 LAB — COMPREHENSIVE METABOLIC PANEL
ALT: 18 IU/L (ref 0–32)
AST: 25 IU/L (ref 0–40)
Albumin/Globulin Ratio: 1.4 (ref 1.2–2.2)
Albumin: 4 g/dL (ref 3.8–4.9)
Alkaline Phosphatase: 51 IU/L (ref 44–121)
BUN/Creatinine Ratio: 15 (ref 9–23)
BUN: 11 mg/dL (ref 6–24)
Bilirubin Total: 0.2 mg/dL (ref 0.0–1.2)
CO2: 24 mmol/L (ref 20–29)
Calcium: 9.3 mg/dL (ref 8.7–10.2)
Chloride: 101 mmol/L (ref 96–106)
Creatinine, Ser: 0.73 mg/dL (ref 0.57–1.00)
Globulin, Total: 2.9 g/dL (ref 1.5–4.5)
Glucose: 119 mg/dL — ABNORMAL HIGH (ref 70–99)
Potassium: 4.5 mmol/L (ref 3.5–5.2)
Sodium: 138 mmol/L (ref 134–144)
Total Protein: 6.9 g/dL (ref 6.0–8.5)
eGFR: 97 mL/min/{1.73_m2} (ref >59)

## 2022-08-20 LAB — LIPID PANEL
Chol/HDL Ratio: 2 ratio (ref 0.0–4.4)
Cholesterol, Total: 137 mg/dL (ref 100–199)
HDL: 69 mg/dL (ref >39)
LDL Chol Calc (NIH): 58 mg/dL (ref 0–99)
Triglycerides: 41 mg/dL (ref 0–149)
VLDL Cholesterol Cal: 10 mg/dL (ref 5–40)

## 2022-08-20 LAB — CBC WITH DIFFERENTIAL/PLATELET
Basophils Absolute: 0 10*3/uL (ref 0.0–0.2)
Basos: 0 %
EOS (ABSOLUTE): 0.1 10*3/uL (ref 0.0–0.4)
Eos: 1 %
Hematocrit: 30.5 % — ABNORMAL LOW (ref 34.0–46.6)
Hemoglobin: 9.7 g/dL — ABNORMAL LOW (ref 11.1–15.9)
Immature Grans (Abs): 0 10*3/uL (ref 0.0–0.1)
Immature Granulocytes: 0 %
Lymphocytes Absolute: 3.1 10*3/uL (ref 0.7–3.1)
Lymphs: 48 %
MCH: 31.2 pg (ref 26.6–33.0)
MCHC: 31.8 g/dL (ref 31.5–35.7)
MCV: 98 fL — ABNORMAL HIGH (ref 79–97)
Monocytes Absolute: 0.5 10*3/uL (ref 0.1–0.9)
Monocytes: 8 %
Neutrophils Absolute: 2.9 10*3/uL (ref 1.4–7.0)
Neutrophils: 43 %
Platelets: 236 10*3/uL (ref 150–450)
RBC: 3.11 x10E6/uL — ABNORMAL LOW (ref 3.77–5.28)
RDW: 13.8 % (ref 11.7–15.4)
WBC: 6.6 10*3/uL (ref 3.4–10.8)

## 2022-08-20 LAB — VITAMIN D 25 HYDROXY (VIT D DEFICIENCY, FRACTURES): Vit D, 25-Hydroxy: 47.7 ng/mL (ref 30.0–100.0)

## 2022-08-20 LAB — TSH: TSH: 1.92 u[IU]/mL (ref 0.450–4.500)

## 2022-08-20 LAB — HEMOGLOBIN A1C
Est. average glucose Bld gHb Est-mCnc: 183 mg/dL
Hgb A1c MFr Bld: 8 % — ABNORMAL HIGH (ref 4.8–5.6)

## 2022-09-09 ENCOUNTER — Other Ambulatory Visit: Payer: Self-pay | Admitting: Physician Assistant

## 2022-09-09 DIAGNOSIS — Z1231 Encounter for screening mammogram for malignant neoplasm of breast: Secondary | ICD-10-CM

## 2022-09-14 ENCOUNTER — Other Ambulatory Visit: Payer: Self-pay | Admitting: Physician Assistant

## 2022-09-14 DIAGNOSIS — E119 Type 2 diabetes mellitus without complications: Secondary | ICD-10-CM

## 2022-09-30 ENCOUNTER — Other Ambulatory Visit: Payer: Self-pay

## 2022-09-30 DIAGNOSIS — E1165 Type 2 diabetes mellitus with hyperglycemia: Secondary | ICD-10-CM

## 2022-09-30 MED ORDER — PIOGLITAZONE HCL 45 MG PO TABS: 45.0000 mg | ORAL_TABLET | Freq: Every day | ORAL | 0 refills | Status: DC

## 2022-10-13 ENCOUNTER — Telehealth: Payer: Self-pay

## 2022-10-13 NOTE — Telephone Encounter (Signed)
I left a message on the number(s) listed in the patients chart requesting the patient to call back regarding the upcomming appointment for 12/05/2022. The provider is out of the office that day. The appointment has been canceled. Waiting for the patient to return the call.

## 2022-10-22 ENCOUNTER — Ambulatory Visit
Admission: RE | Admit: 2022-10-22 | Discharge: 2022-10-22 | Disposition: A | Discharge status: Home / Self Care | Payer: BC Managed Care – PPO | Source: Ambulatory Visit | Attending: Physician Assistant | Admitting: Physician Assistant

## 2022-10-22 DIAGNOSIS — Z1231 Encounter for screening mammogram for malignant neoplasm of breast: Secondary | ICD-10-CM | POA: Diagnosis not present

## 2022-11-01 ENCOUNTER — Other Ambulatory Visit: Payer: Self-pay | Admitting: Physician Assistant

## 2022-11-01 DIAGNOSIS — F419 Anxiety disorder, unspecified: Secondary | ICD-10-CM

## 2022-11-03 ENCOUNTER — Other Ambulatory Visit: Payer: Self-pay | Admitting: Physician Assistant

## 2022-11-03 DIAGNOSIS — E119 Type 2 diabetes mellitus without complications: Secondary | ICD-10-CM

## 2022-11-07 ENCOUNTER — Other Ambulatory Visit: Payer: Self-pay | Admitting: Physician Assistant

## 2022-11-07 DIAGNOSIS — E119 Type 2 diabetes mellitus without complications: Secondary | ICD-10-CM

## 2022-11-07 DIAGNOSIS — F419 Anxiety disorder, unspecified: Secondary | ICD-10-CM

## 2022-12-05 ENCOUNTER — Encounter: Payer: BC Managed Care – PPO | Admitting: Physician Assistant

## 2022-12-09 ENCOUNTER — Encounter: Payer: Self-pay | Admitting: Physician Assistant

## 2022-12-09 ENCOUNTER — Ambulatory Visit (INDEPENDENT_AMBULATORY_CARE_PROVIDER_SITE_OTHER): Payer: BC Managed Care – PPO | Admitting: Physician Assistant

## 2022-12-09 VITALS — BP 112/68 | HR 78 | Temp 97.3°F | Ht 65.0 in | Wt 178.0 lb

## 2022-12-09 DIAGNOSIS — Z0000 Encounter for general adult medical examination without abnormal findings: Secondary | ICD-10-CM | POA: Diagnosis not present

## 2022-12-09 DIAGNOSIS — E1165 Type 2 diabetes mellitus with hyperglycemia: Secondary | ICD-10-CM | POA: Diagnosis not present

## 2022-12-09 DIAGNOSIS — F419 Anxiety disorder, unspecified: Secondary | ICD-10-CM | POA: Insufficient documentation

## 2022-12-09 LAB — POCT URINALYSIS DIP (CLINITEK)
Bilirubin, UA: NEGATIVE
Blood, UA: NEGATIVE
Glucose, UA: NEGATIVE mg/dL
Ketones, POC UA: NEGATIVE mg/dL
Leukocytes, UA: NEGATIVE
Nitrite, UA: NEGATIVE
POC PROTEIN,UA: NEGATIVE
Spec Grav, UA: 1.025 (ref 1.010–1.025)
Urobilinogen, UA: NEGATIVE E.U./dL — AB
pH, UA: 6.5 (ref 5.0–8.0)

## 2022-12-09 MED ORDER — LEVOCETIRIZINE DIHYDROCHLORIDE 5 MG PO TABS: 5.0000 mg | ORAL_TABLET | Freq: Every evening | ORAL | 1 refills | Status: DC

## 2022-12-09 MED ORDER — ROSUVASTATIN CALCIUM 5 MG PO TABS
5.0000 mg | ORAL_TABLET | Freq: Every day | ORAL | 1 refills | Status: DC
Start: 2022-12-09 — End: 2023-01-30

## 2022-12-09 MED ORDER — CITALOPRAM HYDROBROMIDE 10 MG PO TABS
10.0000 mg | ORAL_TABLET | Freq: Every day | ORAL | 1 refills | Status: DC
Start: 2022-12-09 — End: 2023-01-30

## 2022-12-09 MED ORDER — PIOGLITAZONE HCL 45 MG PO TABS: 45.0000 mg | ORAL_TABLET | Freq: Every day | ORAL | 1 refills | Status: DC

## 2022-12-09 NOTE — Progress Notes (Signed)
Subjective:  Patient ID: Katie White, female    DOB: 12/16/1965  Age: 57 y.o. MRN: [id number]  Chief Complaint  Patient presents with   Annual Exam    HPI Well Adult Physical: Patient here for a comprehensive physical exam.The patient reports no problems Do you take any herbs or supplements that were not prescribed by a doctor? no Are you taking calcium supplements? no Are you taking aspirin daily? no  Encounter for general adult medical examination without abnormal findings  Physical ("At Risk" items are starred): Patient's last physical exam was 1 year ago .  Patient is not afflicted from Stress Incontinence and Urge Incontinence  Patient wears a seat belts Patient has smoke detectors and has carbon monoxide detectors. Patient practices appropriate gun safety. Patient wears sunscreen with extended sun exposure. Dental Care: brushes and flosses daily. Last dental visit: up to date Vision impairments: has glasses Ophthalmology/Optometry: Annual visit.  Hearing loss: none  Postmenopausal - LMP 2-3 years ago  Safe at home: Yes Self breast exams: Yes Last pap: unsure Last mammogram: 8/24 - normal     08/19/2022   11:20 AM 10/23/2020    1:42 PM 11/16/2019   11:08 AM  Depression screen PHQ 2/9  Decreased Interest 0 0 0  Down, Depressed, Hopeless 0 1 0  PHQ - 2 Score 0 1 0         10/23/2020    1:43 PM 08/19/2022   11:20 AM  Fall Risk  Falls in the past year? 0 0  Was there an injury with Fall? 0 0  Fall Risk Category Calculator 0 0  Fall Risk Category (Retired) Low   (RETIRED) Patient Fall Risk Level Low fall risk   Patient at Risk for Falls Due to No Fall Risks No Fall Risks  Fall risk Follow up  Falls evaluation completed             Social Hx   Social History   Socioeconomic History   Marital status: Single    Spouse name: Not on file   Number of children: 1   Years of education: Not on file   Highest education level: Not on file  Occupational History    Occupation: Instructor  Tobacco Use   Smoking status: Former    Current packs/day: 0.00    Average packs/day: 0.5 packs/day for 4.0 years (2.0 ttl pk-yrs)    Types: Cigarettes    Start date: 2020    Quit date: 03/17/2022    Years since quitting: 0.7   Smokeless tobacco: Never  Vaping Use   Vaping status: Never Used  Substance and Sexual Activity   Alcohol use: Yes    Alcohol/week: 1.0 standard drink of alcohol    Types: 1 Glasses of wine per week    Comment: rarely   Drug use: Never   Sexual activity: Not Currently  Other Topics Concern   Not on file  Social History Narrative   Not on file   Social Determinants of Health   Financial Resource Strain: Low Risk  (08/19/2022)   Overall Financial Resource Strain (CARDIA)    Difficulty of Paying Living Expenses: Not hard at all  Food Insecurity: No Food Insecurity (08/19/2022)   Hunger Vital Sign    Worried About Running Out of Food in the Last Year: Never true    Ran Out of Food in the Last Year: Never true  Transportation Needs: No Transportation Needs (08/19/2022)   PRAPARE - Transportation  Lack of Transportation (Medical): No    Lack of Transportation (Non-Medical): No  Physical Activity: Insufficiently Active (08/19/2022)   Exercise Vital Sign    Days of Exercise per Week: 3 days    Minutes of Exercise per Session: 30 min  Stress: No Stress Concern Present (08/19/2022)   [employer] of Occupational Health - Occupational Stress Questionnaire    Feeling of Stress : Not at all  Social Connections: Moderately Isolated (08/19/2022)   Social Connection and Isolation Panel [NHANES]    Frequency of Communication with Friends and Family: More than three times a week    Frequency of Social Gatherings with Friends and Family: Three times a week    Attends Religious Services: More than 4 times per year    Active Member of Clubs or Organizations: No    Attends Banker Meetings: Never    Marital Status: Never married    Past Medical History:  Diagnosis Date   Mixed hyperlipidemia 11/16/2019   Type 2 diabetes mellitus without complications (HCC) 11/16/2019   Past Surgical History:  Procedure Laterality Date   CESAREAN SECTION  09/1984    Family History  Problem Relation Age of Onset   Other Mother        covid 98   Kidney failure Mother    Stroke Mother    Diabetes Mellitus II Mother    Heart attack Brother    Diabetes Mellitus II Brother     ROS CONSTITUTIONAL: Negative for chills, fatigue, fever, unintentional weight gain and unintentional weight loss.  E/N/T: Negative for ear pain, nasal congestion and sore throat.  CARDIOVASCULAR: Negative for chest pain, dizziness, palpitations and pedal edema.  RESPIRATORY: Negative for recent cough and dyspnea.  GASTROINTESTINAL: Negative for abdominal pain, acid reflux symptoms, constipation, diarrhea, nausea and vomiting.  MSK: Negative for arthralgias and myalgias.  INTEGUMENTARY: Negative for rash.  NEUROLOGICAL: Negative for dizziness and headaches.  PSYCHIATRIC: Negative for sleep disturbance and to question depression screen.  Negative for depression, negative for anhedonia.   Objective:  PHYSICAL EXAM:  Jacqua marsh CMA chaperone BP 112/68 (BP Location: Left Arm, Patient Position: Sitting)   Pulse 78   Temp (!) 97.3 F (36.3 C) (Oral)   Ht 5\' 5"  (1.651 m)   Wt 178 lb (80.7 kg)   SpO2 100%   BMI 29.62 kg/m    GEN: Well nourished, well developed, in no acute distress  HEENT: normal external ears and nose - normal external auditory canals and TMS - hearing grossly normal - - Lips, Teeth and Gums - normal  Oropharynx - normal mucosa, palate, and posterior pharynx Neck: no JVD or masses - no thyromegaly Cardiac: RRR; no murmurs, rubs, or gallops,no edema - no significant varicosities Respiratory:  normal respiratory rate and pattern with no distress - normal breath sounds with no rales, rhonchi, wheezes or rubs Breasts - pt deferred GI:  normal bowel sounds, no masses or tenderness Gu - normal external genitalia - pap obtained - bimanual exam normal MS: no deformity or atrophy  Skin: warm and dry, no rash  Neuro:  Alert and Oriented x 3, Strength and sensation are intact - CN II-Xii grossly intact Psych: euthymic mood, appropriate affect and demeanor  Office Visit on 12/09/2022  Component Date Value Ref Range Status   Glucose, UA 12/09/2022 negative  negative mg/dL Final   Bilirubin, UA 59/56/3875 negative  negative Final   Ketones, POC UA 12/09/2022 negative  negative mg/dL Final   Spec Grav, UA  12/09/2022 1.025  1.010 - 1.025 Final   Blood, UA 12/09/2022 negative  negative Final   pH, UA 12/09/2022 6.5  5.0 - 8.0 Final   POC PROTEIN,UA 12/09/2022 negative  negative, trace Final   Urobilinogen, UA 12/09/2022 negative (A)  0.2 or 1.0 E.U./dL Final   Nitrite, UA 08/65/7846 Negative  Negative Final   Leukocytes, UA 12/09/2022 Negative  Negative Final     Assessment & Plan:  Routine physical examination -     POCT URINALYSIS DIP (CLINITEK) -     IGP, Aptima HPV, rfx 16/18,45 -     Influenza, MDCK, trivalent, PF(Flucelvax egg-free)  Anxiety -     Citalopram Hydrobromide; Take 1 tablet (10 mg total) by mouth daily.  Dispense: 90 tablet; Refill: 1  Type 2 diabetes mellitus with hyperglycemia, without long-term current use of insulin (HCC) -     Pioglitazone HCl; Take 1 tablet (45 mg total) by mouth daily.  Dispense: 90 tablet; Refill: 1 -     Rosuvastatin Calcium; Take 1 tablet (5 mg total) by mouth daily.  Dispense: 90 tablet; Refill: 1  Other orders -     Levocetirizine Dihydrochloride; Take 1 tablet (5 mg total) by mouth every evening.  Dispense: 90 tablet; Refill: 1    This is a list of the screening recommended for you and due dates:  Health Maintenance  Topic Date Due   Pap with HPV screening  Never done   Yearly kidney health urinalysis for diabetes  09/21/2022   Eye exam for diabetics  11/23/2022    Zoster (Shingles) Vaccine (1 of 2) 03/10/2023*   Cologuard (Stool DNA test)  12/12/2022   Hemoglobin A1C  02/18/2023   Yearly kidney function blood test for diabetes  08/19/2023   Complete foot exam   08/19/2023   Mammogram  10/22/2023   DTaP/Tdap/Td vaccine (2 - Td or Tdap) 05/25/2030   Flu Shot  Completed   HPV Vaccine  Aged Out   COVID-19 Vaccine  Discontinued   Hepatitis C Screening  Discontinued   HIV Screening  Discontinued  *Topic was postponed. The date shown is not the original due date.     Follow-up: Return in about 2 months (around 02/08/2023) for as scheduled for next chronic follow up, chronic fasting follow-up.  An After Visit Summary was printed and given to the patient.  Jettie Pagan [facility] [phone]

## 2022-12-12 LAB — IGP, APTIMA HPV, RFX 16/18,45
HPV Aptima: NEGATIVE
PAP Smear Comment: 0

## 2023-01-29 ENCOUNTER — Ambulatory Visit (INDEPENDENT_AMBULATORY_CARE_PROVIDER_SITE_OTHER): Payer: BC Managed Care – PPO | Admitting: Physician Assistant

## 2023-01-29 ENCOUNTER — Encounter: Payer: Self-pay | Admitting: Physician Assistant

## 2023-01-29 VITALS — BP 124/78 | HR 63 | Temp 97.9°F | Ht 65.0 in | Wt 180.0 lb

## 2023-01-29 DIAGNOSIS — E785 Hyperlipidemia, unspecified: Secondary | ICD-10-CM | POA: Diagnosis not present

## 2023-01-29 DIAGNOSIS — E1165 Type 2 diabetes mellitus with hyperglycemia: Secondary | ICD-10-CM

## 2023-01-29 DIAGNOSIS — E1169 Type 2 diabetes mellitus with other specified complication: Secondary | ICD-10-CM | POA: Diagnosis not present

## 2023-01-29 DIAGNOSIS — E559 Vitamin D deficiency, unspecified: Secondary | ICD-10-CM

## 2023-01-29 DIAGNOSIS — Z1211 Encounter for screening for malignant neoplasm of colon: Secondary | ICD-10-CM

## 2023-01-29 NOTE — Progress Notes (Signed)
Subjective:  Patient ID: Katie White, female    DOB: 1965-10-01  Age: 57 y.o. MRN: [id number]  Chief Complaint  Patient presents with   Medical Management of Chronic Issues         Jahya Gago has a history of type 2 Diabetes Mellitus for several years. Current treatment includes actos 45mg  and janumet 50/1000mg   She denies hypoglycemic episodes. She states blood glucose levels at home range from around 120  She  is consuming a heart healthy diet and exercising regularly. She performs diabetic foot checks daily after showering. She is due for eye exam She is on ACE inhibitor and statin  Pt with history of anxiety - currently stable on celexa 10mg  qd  Mixed hyperlipidemia  Pt presents with hyperlipidemia.  Compliance with treatment has been good -The patient is compliant with medications, maintains a low cholesterol diet , follows up as directed , and maintains an exercise regimen . The patient denies experiencing any hypercholesterolemia related symptoms. Currently on crestor 5mg  qd  Pt would like to do cologuard  Lab Results  Component Value Date   HGBA1C 8.0 (H) 08/19/2022   HGBA1C 7.6 (H) 01/24/2022   HGBA1C 7.4 (H) 09/20/2021    Current Outpatient Medications on File Prior to Visit  Medication Sig Dispense Refill   aspirin EC 81 MG tablet Take 81 mg by mouth daily. Swallow whole.     citalopram (CELEXA) 10 MG tablet Take 1 tablet (10 mg total) by mouth daily. 90 tablet 1   JANUMET 50-1000 MG tablet TAKE 1 TABLET BY MOUTH TWICE A DAY WITH A MEAL 60 tablet 5   levocetirizine (XYZAL) 5 MG tablet Take 1 tablet (5 mg total) by mouth every evening. 90 tablet 1   lisinopril (ZESTRIL) 2.5 MG tablet Take 1 tablet (2.5 mg total) by mouth daily. 90 tablet 1   pioglitazone (ACTOS) 45 MG tablet Take 1 tablet (45 mg total) by mouth daily. 90 tablet 1   rosuvastatin (CRESTOR) 5 MG tablet Take 1 tablet (5 mg total) by mouth daily. 90 tablet 1   Vitamin D, Ergocalciferol, (DRISDOL)  1.25 MG (50000 UNIT) CAPS capsule Take 1 capsule (50,000 Units total) by mouth every 7 (seven) days. 5 capsule 5   No current facility-administered medications on file prior to visit.   Past Medical History:  Diagnosis Date   Mixed hyperlipidemia 11/16/2019   Type 2 diabetes mellitus without complications (HCC) 11/16/2019   Past Surgical History:  Procedure Laterality Date   CESAREAN SECTION  09/1984    Family History  Problem Relation Age of Onset   Other Mother        covid 69   Kidney failure Mother    Stroke Mother    Diabetes Mellitus II Mother    Heart attack Brother    Diabetes Mellitus II Brother    Social History   Socioeconomic History   Marital status: Single    Spouse name: Not on file   Number of children: 1   Years of education: Not on file   Highest education level: Not on file  Occupational History   Occupation: Secondary school teacher  Tobacco Use   Smoking status: Former    Current packs/day: 0.00    Average packs/day: 0.5 packs/day for 4.0 years (2.0 ttl pk-yrs)    Types: Cigarettes    Start date: 2020    Quit date: 03/17/2022    Years since quitting: 0.8   Smokeless tobacco: Never  Vaping Use   Vaping  status: Never Used  Substance and Sexual Activity   Alcohol use: Yes    Alcohol/week: 1.0 standard drink of alcohol    Types: 1 Glasses of wine per week    Comment: rarely   Drug use: Never   Sexual activity: Not Currently  Other Topics Concern   Not on file  Social History Narrative   Not on file   Social Determinants of Health   Financial Resource Strain: Low Risk  (08/19/2022)   Overall Financial Resource Strain (CARDIA)    Difficulty of Paying Living Expenses: Not hard at all  Food Insecurity: No Food Insecurity (08/19/2022)   Hunger Vital Sign    Worried About Running Out of Food in the Last Year: Never true    Ran Out of Food in the Last Year: Never true  Transportation Needs: No Transportation Needs (08/19/2022)   PRAPARE - Scientist, research (physical sciences) (Medical): No    Lack of Transportation (Non-Medical): No  Physical Activity: Insufficiently Active (08/19/2022)   Exercise Vital Sign    Days of Exercise per Week: 3 days    Minutes of Exercise per Session: 30 min  Stress: No Stress Concern Present (08/19/2022)   [employer] of Occupational Health - Occupational Stress Questionnaire    Feeling of Stress : Not at all  Social Connections: Moderately Isolated (08/19/2022)   Social Connection and Isolation Panel [NHANES]    Frequency of Communication with Friends and Family: More than three times a week    Frequency of Social Gatherings with Friends and Family: Three times a week    Attends Religious Services: More than 4 times per year    Active Member of Clubs or Organizations: No    Attends Banker Meetings: Never    Marital Status: Never married   CONSTITUTIONAL: Negative for chills, fatigue, fever, unintentional weight gain and unintentional weight loss.  E/N/T: Negative for ear pain, nasal congestion and sore throat.  CARDIOVASCULAR: Negative for chest pain, dizziness, palpitations and pedal edema.  RESPIRATORY: Negative for recent cough and dyspnea.  GASTROINTESTINAL: Negative for abdominal pain, acid reflux symptoms, constipation, diarrhea, nausea and vomiting.  MSK: Negative for arthralgias and myalgias.  INTEGUMENTARY: Negative for rash.  NEUROLOGICAL: Negative for dizziness and headaches.  PSYCHIATRIC: Negative for sleep disturbance and to question depression screen.  Negative for depression, negative for anhedonia.      Objective:  PHYSICAL EXAM:   VS: BP 124/78 (BP Location: Left Arm, Patient Position: Sitting)   Pulse 63   Temp 97.9 F (36.6 C) (Temporal)   Ht 5\' 5"  (1.651 m)   Wt 180 lb (81.6 kg)   SpO2 100%   BMI 29.95 kg/m   GEN: Well nourished, well developed, in no acute distress   Cardiac: RRR; no murmurs, rubs, or gallops,no edema -  Respiratory:  normal respiratory rate and  pattern with no distress - normal breath sounds with no rales, rhonchi, wheezes or rubs  MS: no deformity or atrophy  Skin: warm and dry, no rash  Neuro:  Alert and Oriented x 3,  - CN II-Xii grossly intact Psych: euthymic mood, appropriate affect and demeanor   Lab Results  Component Value Date   WBC 6.6 08/19/2022   HGB 9.7 (L) 08/19/2022   HCT 30.5 (L) 08/19/2022   PLT 236 08/19/2022   GLUCOSE 119 (H) 08/19/2022   CHOL 137 08/19/2022   TRIG 41 08/19/2022   HDL 69 08/19/2022   LDLCALC 58 08/19/2022  ALT 18 08/19/2022   AST 25 08/19/2022   NA 138 08/19/2022   K 4.5 08/19/2022   CL 101 08/19/2022   CREATININE 0.73 08/19/2022   BUN 11 08/19/2022   CO2 24 08/19/2022   TSH 1.920 08/19/2022   HGBA1C 8.0 (H) 08/19/2022   MICROALBUR 80 11/16/2019      Assessment & Plan:   Problem List Items Addressed This Visit       Other   Hyperlipidemia associated with diabetes (HCC)   Relevant Orders   CBC with Differential/Platelet   Comprehensive metabolic panel   Lipid panel Conitnue crestor Low fat diet            Other Visit Diagnoses     Type 2 diabetes mellitus with hyperglycemia, without long-term current use of insulin (HCC)    -  Primary   Relevant Orders   CBC with Differential/Platelet   Comprehensive metabolic panel   TSH   Hemoglobin A1c Watch diet and continue meds   Vitamin D insufficiency       Relevant Orders   VITAMIN D 25 Hydroxy (Vit-D Deficiency, Fractures)   Anxiety     Continue celexa            Colon cancer screening Cologuard ordered                                .  No orders of the defined types were placed in this encounter.   Orders Placed This Encounter  Procedures   CBC with Differential/Platelet   Comprehensive metabolic panel   Hemoglobin A1c   Lipid panel   VITAMIN D 25 Hydroxy (Vit-D Deficiency, Fractures)   Microalbumin / creatinine urine ratio   Cologuard     Follow-up: No follow-ups on file.  An  After Visit Summary was printed and given to the patient.  Jettie Pagan [facility] [phone]

## 2023-01-30 ENCOUNTER — Telehealth: Payer: Self-pay | Admitting: Physician Assistant

## 2023-01-30 ENCOUNTER — Other Ambulatory Visit: Payer: Self-pay

## 2023-01-30 ENCOUNTER — Other Ambulatory Visit: Payer: Self-pay | Admitting: Physician Assistant

## 2023-01-30 DIAGNOSIS — F419 Anxiety disorder, unspecified: Secondary | ICD-10-CM

## 2023-01-30 DIAGNOSIS — R899 Unspecified abnormal finding in specimens from other organs, systems and tissues: Secondary | ICD-10-CM

## 2023-01-30 DIAGNOSIS — E1165 Type 2 diabetes mellitus with hyperglycemia: Secondary | ICD-10-CM

## 2023-01-30 DIAGNOSIS — E559 Vitamin D deficiency, unspecified: Secondary | ICD-10-CM

## 2023-01-30 DIAGNOSIS — E119 Type 2 diabetes mellitus without complications: Secondary | ICD-10-CM

## 2023-01-30 LAB — CBC WITH DIFFERENTIAL/PLATELET
Basophils Absolute: 0 10*3/uL (ref 0.0–0.2)
Basos: 0 %
EOS (ABSOLUTE): 0.1 10*3/uL (ref 0.0–0.4)
Eos: 1 %
Hematocrit: 32 % — ABNORMAL LOW (ref 34.0–46.6)
Hemoglobin: 10.1 g/dL — ABNORMAL LOW (ref 11.1–15.9)
Immature Grans (Abs): 0 10*3/uL (ref 0.0–0.1)
Immature Granulocytes: 0 %
Lymphocytes Absolute: 2.3 10*3/uL (ref 0.7–3.1)
Lymphs: 35 %
MCH: 31.1 pg (ref 26.6–33.0)
MCHC: 31.6 g/dL (ref 31.5–35.7)
MCV: 99 fL — ABNORMAL HIGH (ref 79–97)
Monocytes Absolute: 0.6 10*3/uL (ref 0.1–0.9)
Monocytes: 10 %
Neutrophils Absolute: 3.6 10*3/uL (ref 1.4–7.0)
Neutrophils: 54 %
Platelets: 236 10*3/uL (ref 150–450)
RBC: 3.25 x10E6/uL — ABNORMAL LOW (ref 3.77–5.28)
RDW: 13.9 % (ref 11.7–15.4)
WBC: 6.6 10*3/uL (ref 3.4–10.8)

## 2023-01-30 LAB — HEMOGLOBIN A1C
Est. average glucose Bld gHb Est-mCnc: 260 mg/dL
Hgb A1c MFr Bld: 10.7 % — ABNORMAL HIGH (ref 4.8–5.6)

## 2023-01-30 LAB — LIPID PANEL
Chol/HDL Ratio: 2.1 ratio (ref 0.0–4.4)
Cholesterol, Total: 148 mg/dL (ref 100–199)
HDL: 72 mg/dL (ref >39)
LDL Chol Calc (NIH): 65 mg/dL (ref 0–99)
Triglycerides: 52 mg/dL (ref 0–149)
VLDL Cholesterol Cal: 11 mg/dL (ref 5–40)

## 2023-01-30 LAB — COMPREHENSIVE METABOLIC PANEL
ALT: 17 [IU]/L (ref 0–32)
AST: 20 [IU]/L (ref 0–40)
Albumin: 4.2 g/dL (ref 3.8–4.9)
Alkaline Phosphatase: 58 [IU]/L (ref 44–121)
BUN/Creatinine Ratio: 14 (ref 9–23)
BUN: 11 mg/dL (ref 6–24)
Bilirubin Total: 0.3 mg/dL (ref 0.0–1.2)
CO2: 22 mmol/L (ref 20–29)
Calcium: 9 mg/dL (ref 8.7–10.2)
Chloride: 103 mmol/L (ref 96–106)
Creatinine, Ser: 0.81 mg/dL (ref 0.57–1.00)
Globulin, Total: 2.6 g/dL (ref 1.5–4.5)
Glucose: 175 mg/dL — ABNORMAL HIGH (ref 70–99)
Potassium: 4.7 mmol/L (ref 3.5–5.2)
Sodium: 139 mmol/L (ref 134–144)
Total Protein: 6.8 g/dL (ref 6.0–8.5)
eGFR: 86 mL/min/{1.73_m2} (ref >59)

## 2023-01-30 LAB — VITAMIN D 25 HYDROXY (VIT D DEFICIENCY, FRACTURES): Vit D, 25-Hydroxy: 45.4 ng/mL (ref 30.0–100.0)

## 2023-01-30 LAB — MICROALBUMIN / CREATININE URINE RATIO
Creatinine, Urine: 32.8 mg/dL
Microalb/Creat Ratio: 13 mg/g{creat} (ref 0–29)
Microalbumin, Urine: 4.4 ug/mL

## 2023-01-30 MED ORDER — LISINOPRIL 2.5 MG PO TABS: 2.5000 mg | ORAL_TABLET | Freq: Every day | ORAL | 1 refills | Status: DC

## 2023-01-30 MED ORDER — VITAMIN D (ERGOCALCIFEROL) 1.25 MG (50000 UNIT) PO CAPS: 50000.0000 [IU] | ORAL_CAPSULE | ORAL | 5 refills | Status: DC

## 2023-01-30 MED ORDER — ROSUVASTATIN CALCIUM 5 MG PO TABS: 5.0000 mg | ORAL_TABLET | Freq: Every day | ORAL | 1 refills | Status: DC

## 2023-01-30 MED ORDER — CITALOPRAM HYDROBROMIDE 10 MG PO TABS: 10.0000 mg | ORAL_TABLET | Freq: Every day | ORAL | 1 refills | Status: DC

## 2023-01-30 MED ORDER — LEVOCETIRIZINE DIHYDROCHLORIDE 5 MG PO TABS: 5.0000 mg | ORAL_TABLET | Freq: Every evening | ORAL | 1 refills | Status: DC

## 2023-01-30 NOTE — Telephone Encounter (Signed)
error 

## 2023-02-03 LAB — IRON,TIBC AND FERRITIN PANEL
Ferritin: 36 ng/mL (ref 15–150)
Iron Saturation: 15 % (ref 15–55)
Iron: 55 ug/dL (ref 27–159)
Total Iron Binding Capacity: 369 ug/dL (ref 250–450)
UIBC: 314 ug/dL (ref 131–425)

## 2023-02-03 LAB — SPECIMEN STATUS REPORT

## 2023-03-02 ENCOUNTER — Ambulatory Visit (INDEPENDENT_AMBULATORY_CARE_PROVIDER_SITE_OTHER): Payer: BC Managed Care – PPO | Admitting: Physician Assistant

## 2023-03-02 ENCOUNTER — Encounter: Payer: Self-pay | Admitting: Physician Assistant

## 2023-03-02 VITALS — BP 130/62 | HR 74 | Temp 97.5°F | Resp 14 | Ht 65.0 in | Wt 179.0 lb

## 2023-03-02 DIAGNOSIS — E1165 Type 2 diabetes mellitus with hyperglycemia: Secondary | ICD-10-CM

## 2023-03-02 DIAGNOSIS — R899 Unspecified abnormal finding in specimens from other organs, systems and tissues: Secondary | ICD-10-CM

## 2023-03-02 LAB — COMPREHENSIVE METABOLIC PANEL
ALT: 20 [IU]/L (ref 0–32)
AST: 20 [IU]/L (ref 0–40)
Albumin: 4.4 g/dL (ref 3.8–4.9)
Alkaline Phosphatase: 70 [IU]/L (ref 44–121)
BUN/Creatinine Ratio: 20 (ref 9–23)
BUN: 17 mg/dL (ref 6–24)
Bilirubin Total: 0.2 mg/dL (ref 0.0–1.2)
CO2: 21 mmol/L (ref 20–29)
Calcium: 9.1 mg/dL (ref 8.7–10.2)
Chloride: 98 mmol/L (ref 96–106)
Creatinine, Ser: 0.86 mg/dL (ref 0.57–1.00)
Globulin, Total: 2.8 g/dL (ref 1.5–4.5)
Glucose: 245 mg/dL — ABNORMAL HIGH (ref 70–99)
Potassium: 5 mmol/L (ref 3.5–5.2)
Sodium: 135 mmol/L (ref 134–144)
Total Protein: 7.2 g/dL (ref 6.0–8.5)
eGFR: 80 mL/min/{1.73_m2} (ref >59)

## 2023-03-02 LAB — CBC WITH DIFFERENTIAL/PLATELET
Basophils Absolute: 0 10*3/uL (ref 0.0–0.2)
Basos: 0 %
EOS (ABSOLUTE): 0.1 10*3/uL (ref 0.0–0.4)
Eos: 1 %
Hematocrit: 33.9 % — ABNORMAL LOW (ref 34.0–46.6)
Hemoglobin: 11.1 g/dL (ref 11.1–15.9)
Immature Grans (Abs): 0 10*3/uL (ref 0.0–0.1)
Immature Granulocytes: 0 %
Lymphocytes Absolute: 3 10*3/uL (ref 0.7–3.1)
Lymphs: 36 %
MCH: 31.2 pg (ref 26.6–33.0)
MCHC: 32.7 g/dL (ref 31.5–35.7)
MCV: 95 fL (ref 79–97)
Monocytes Absolute: 0.7 10*3/uL (ref 0.1–0.9)
Monocytes: 9 %
Neutrophils Absolute: 4.5 10*3/uL (ref 1.4–7.0)
Neutrophils: 54 %
Platelets: 221 10*3/uL (ref 150–450)
RBC: 3.56 x10E6/uL — ABNORMAL LOW (ref 3.77–5.28)
RDW: 13.2 % (ref 11.7–15.4)
WBC: 8.4 10*3/uL (ref 3.4–10.8)

## 2023-03-02 NOTE — Progress Notes (Signed)
Subjective:  Patient ID: Katie White, female    DOB: 08-26-1965  Age: 57 y.o. MRN: [id number]  Chief Complaint  Patient presents with   Medical Management of Chronic Issues    HPI Pt in today to discuss diabetes - her last A1c a month ago was elevated at 10.7 -she is currently taking janumet 50/1000mg  qd and actos 45mg  qd - with labwork I had suggested adjusting medication but pt was hesitant to do so and wants to work on diet over the next few months and be completely compliant with meds She states fasting glucose ranges in the 170s and then as she walks and is active drops more toward 120s Will recheck cmp today but wait until next visit for follow up hgb A1c  Pt with low hgb - she did not come pick up hemoccults so those are given today Her iron studies were normal - she states she has been told her hgb has been low 'all her life'     08/19/2022   11:20 AM 10/23/2020    1:42 PM 11/16/2019   11:08 AM  Depression screen PHQ 2/9  Decreased Interest 0 0 0  Down, Depressed, Hopeless 0 1 0  PHQ - 2 Score 0 1 0        10/23/2020    1:43 PM 08/19/2022   11:20 AM  Fall Risk  Falls in the past year? 0 0  Was there an injury with Fall? 0 0  Fall Risk Category Calculator 0 0  Fall Risk Category (Retired) Low   (RETIRED) Patient Fall Risk Level Low fall risk   Patient at Risk for Falls Due to No Fall Risks No Fall Risks  Fall risk Follow up  Falls evaluation completed     ROS CONSTITUTIONAL: Negative for chills, fatigue, fever,  CARDIOVASCULAR: Negative for chest pain, dizziness, palpitations and pedal edema.  RESPIRATORY: Negative for recent cough and dyspnea.  GASTROINTESTINAL: Negative for abdominal pain, acid reflux symptoms, constipation, diarrhea, nausea and vomiting.     Current Outpatient Medications:    aspirin EC 81 MG tablet, Take 81 mg by mouth daily. Swallow whole., Disp: , Rfl:    citalopram (CELEXA) 10 MG tablet, Take 1 tablet (10 mg total) by mouth daily., Disp: 90  tablet, Rfl: 1   JANUMET 50-1000 MG tablet, TAKE 1 TABLET BY MOUTH TWICE A DAY WITH A MEAL, Disp: 60 tablet, Rfl: 5   levocetirizine (XYZAL) 5 MG tablet, Take 1 tablet (5 mg total) by mouth every evening., Disp: 90 tablet, Rfl: 1   lisinopril (ZESTRIL) 2.5 MG tablet, Take 1 tablet (2.5 mg total) by mouth daily., Disp: 90 tablet, Rfl: 1   Multiple Vitamin (MULTIVITAMIN) LIQD, Take 5 mLs by mouth daily., Disp: , Rfl:    pioglitazone (ACTOS) 45 MG tablet, Take 1 tablet (45 mg total) by mouth daily., Disp: 90 tablet, Rfl: 1   rosuvastatin (CRESTOR) 5 MG tablet, Take 1 tablet (5 mg total) by mouth daily., Disp: 90 tablet, Rfl: 1   Vitamin D, Ergocalciferol, (DRISDOL) 1.25 MG (50000 UNIT) CAPS capsule, Take 1 capsule (50,000 Units total) by mouth every 7 (seven) days., Disp: 5 capsule, Rfl: 5  Past Medical History:  Diagnosis Date   Mixed hyperlipidemia 11/16/2019   Type 2 diabetes mellitus without complications (HCC) 11/16/2019   Objective:  PHYSICAL EXAM:   BP 130/62   Pulse 74   Temp (!) 97.5 F (36.4 C)   Resp 14   Ht 5\' 5"  (1.651 m)  Wt 179 lb (81.2 kg)   SpO2 98%   BMI 29.79 kg/m    GEN: Well nourished, well developed, in no acute distress   Cardiac: RRR; no murmurs, rubs, Respiratory:  normal respiratory rate and pattern with no distress - normal breath sounds with no rales, rhonchi, wheezes or rubs  Skin: warm and dry, no rash   Psych: euthymic mood, appropriate affect and demeanor  Assessment & Plan:    Type 2 diabetes mellitus with hyperglycemia, without long-term current use of insulin (HCC) -     Comprehensive metabolic panel Continue current meds Continue diet and exercise Abnormal laboratory test result -     CBC with Differential/Platelet Hemoccult cards given    Follow-up: Return in about 2 months (around 05/03/2023) for chronic fasting follow-up.  An After Visit Summary was printed and given to the patient.  Jettie Pagan [facility] [phone]

## 2023-03-03 ENCOUNTER — Other Ambulatory Visit: Payer: Self-pay | Admitting: Physician Assistant

## 2023-03-03 MED ORDER — EMPAGLIFLOZIN 10 MG PO TABS: 10.0000 mg | ORAL_TABLET | Freq: Every day | ORAL | 2 refills | Status: DC

## 2023-03-21 ENCOUNTER — Telehealth: Payer: Self-pay

## 2023-03-21 NOTE — Telephone Encounter (Signed)
 Please advice  Copied from CRM [id number]. Topic: Clinical - Prescription Issue >> Mar 20, 2023  8:49 AM Graeme ORN wrote: Reason for CRM: Patient called in stated that [facility] is out of stock for JANUMET  50-1000 MG tablet and do not know when they will get any in. The closest they could find in system was [facility] and patient does not know where that location is. Patient has been out of medication for 1 week and wanted to know if she is able to get a hard copy of the Rx so she can take it to [facility] and see if they have it in stock. Thank You.

## 2023-03-23 ENCOUNTER — Other Ambulatory Visit: Payer: Self-pay | Admitting: Physician Assistant

## 2023-03-23 DIAGNOSIS — E119 Type 2 diabetes mellitus without complications: Secondary | ICD-10-CM

## 2023-03-23 MED ORDER — JANUMET 50-1000 MG PO TABS: 1.0000 | ORAL_TABLET | Freq: Two times a day (BID) | ORAL | 5 refills | Status: DC

## 2023-03-23 NOTE — Telephone Encounter (Signed)
 Notify pt she can come by for hard copy (will print)---- also if she has a hard time finding it we can actually separate the 2 prescriptions - (Januvia and metformin)

## 2023-03-23 NOTE — Telephone Encounter (Signed)
 Also when she comes to get rx she needs to pick up hemoccult cards

## 2023-03-23 NOTE — Telephone Encounter (Signed)
 Prescription and stool cards were placed up front for her to pick up.

## 2023-04-14 DIAGNOSIS — Z1211 Encounter for screening for malignant neoplasm of colon: Secondary | ICD-10-CM | POA: Diagnosis not present

## 2023-04-16 ENCOUNTER — Ambulatory Visit (INDEPENDENT_AMBULATORY_CARE_PROVIDER_SITE_OTHER): Payer: BC Managed Care – PPO

## 2023-04-16 DIAGNOSIS — R899 Unspecified abnormal finding in specimens from other organs, systems and tissues: Secondary | ICD-10-CM | POA: Diagnosis not present

## 2023-04-20 ENCOUNTER — Other Ambulatory Visit: Payer: Self-pay | Admitting: Physician Assistant

## 2023-04-20 DIAGNOSIS — E119 Type 2 diabetes mellitus without complications: Secondary | ICD-10-CM

## 2023-04-20 LAB — POC HEMOCCULT BLD/STL (HOME/3-CARD/SCREEN)
Card #2 Fecal Occult Blod, POC: NEGATIVE
Card #3 Fecal Occult Blood, POC: NEGATIVE
Fecal Occult Blood, POC: NEGATIVE

## 2023-04-20 NOTE — Progress Notes (Signed)
Patient brought hemoccult card. They were negative.

## 2023-04-21 LAB — COLOGUARD: COLOGUARD: POSITIVE — AB

## 2023-04-27 ENCOUNTER — Telehealth: Payer: Self-pay

## 2023-04-27 NOTE — Telephone Encounter (Signed)
 Patient was identified as falling into the [place] Measure - Diabetes.   Patient was: Patient refuses intervention.  Patient has upcoming appointment with provider on 06/01/23.  During our phone conversation patient was offered appointment with our diabetes educator as well as pharmacy team.  She states that she has been working on her diet and she has reviewed the information she was given during her previous nutrition classes and feels that her A1C will be much better at her next office visit.  Patient states that she will be in touch with the office if she changes her mind.  Davee Erm, LPN  04/54/09 81:19 AM

## 2023-06-01 ENCOUNTER — Ambulatory Visit: Payer: BC Managed Care – PPO | Admitting: Physician Assistant

## 2023-06-03 ENCOUNTER — Other Ambulatory Visit: Payer: Self-pay | Admitting: Physician Assistant

## 2023-06-15 ENCOUNTER — Encounter: Payer: Self-pay | Admitting: Physician Assistant

## 2023-06-15 ENCOUNTER — Ambulatory Visit: Admitting: Physician Assistant

## 2023-06-15 VITALS — BP 126/70 | HR 94 | Temp 98.1°F | Resp 18 | Ht 65.0 in | Wt 173.6 lb

## 2023-06-15 DIAGNOSIS — R195 Other fecal abnormalities: Secondary | ICD-10-CM

## 2023-06-15 DIAGNOSIS — E1169 Type 2 diabetes mellitus with other specified complication: Secondary | ICD-10-CM | POA: Diagnosis not present

## 2023-06-15 DIAGNOSIS — E1165 Type 2 diabetes mellitus with hyperglycemia: Secondary | ICD-10-CM | POA: Diagnosis not present

## 2023-06-15 DIAGNOSIS — E785 Hyperlipidemia, unspecified: Secondary | ICD-10-CM

## 2023-06-15 DIAGNOSIS — F419 Anxiety disorder, unspecified: Secondary | ICD-10-CM

## 2023-06-15 DIAGNOSIS — E559 Vitamin D deficiency, unspecified: Secondary | ICD-10-CM

## 2023-06-15 NOTE — Progress Notes (Signed)
 Subjective:  Patient ID: Katie White, female    DOB: January 31, 1966  Age: 58 y.o. MRN: [id number]  Chief Complaint  Patient presents with   Medical Management of Chronic Issues         Tameca Arrighi has a history of type 2 Diabetes Mellitus for several years. Current treatment includes actos 45mg  , jardiance 10mg  qdand janumet 50/1000mg   She denies hypoglycemic episodes. She states blood glucose levels have remained elevated She  is consuming a heart healthy diet and exercising regularly. She performs diabetic foot checks daily after showering. She is due for eye exam- says she has appt this summer She is on ACE inhibitor and statin  Pt with history of anxiety - currently stable on celexa 10mg  qd  Mixed hyperlipidemia  Pt presents with hyperlipidemia.  Compliance with treatment has been good -The patient is compliant with medications, maintains a low cholesterol diet , follows up as directed , and maintains an exercise regimen . The patient denies experiencing any hypercholesterolemia related symptoms. Currently on crestor 5mg  qd  Pt with vit D def - on weekly supplement  Pt had positive cologuard - is now agreeable to schedule with GI  Lab Results  Component Value Date   HGBA1C 10.7 (H) 01/29/2023   HGBA1C 8.0 (H) 08/19/2022   HGBA1C 7.6 (H) 01/24/2022    Current Outpatient Medications on File Prior to Visit  Medication Sig Dispense Refill   aspirin EC 81 MG tablet Take 81 mg by mouth daily. Swallow whole.     citalopram (CELEXA) 10 MG tablet Take 1 tablet (10 mg total) by mouth daily. 90 tablet 1   JARDIANCE 10 MG TABS tablet TAKE 1 TABLET BY MOUTH DAILY BEFORE BREAKFAST. 30 tablet 2   levocetirizine (XYZAL) 5 MG tablet Take 1 tablet (5 mg total) by mouth every evening. 90 tablet 1   lisinopril (ZESTRIL) 2.5 MG tablet Take 1 tablet (2.5 mg total) by mouth daily. 90 tablet 1   Multiple Vitamin (MULTIVITAMIN) LIQD Take 5 mLs by mouth daily.     pioglitazone (ACTOS) 45 MG  tablet Take 1 tablet (45 mg total) by mouth daily. 90 tablet 1   rosuvastatin (CRESTOR) 5 MG tablet Take 1 tablet (5 mg total) by mouth daily. 90 tablet 1   sitaGLIPtin-metformin (JANUMET) 50-1000 MG tablet TAKE 1 TABLET BY MOUTH TWICE A DAY WITH FOOD 60 tablet 2   Vitamin D, Ergocalciferol, (DRISDOL) 1.25 MG (50000 UNIT) CAPS capsule Take 1 capsule (50,000 Units total) by mouth every 7 (seven) days. 5 capsule 5   No current facility-administered medications on file prior to visit.   Past Medical History:  Diagnosis Date   Mixed hyperlipidemia 11/16/2019   Type 2 diabetes mellitus without complications (HCC) 11/16/2019   Past Surgical History:  Procedure Laterality Date   CESAREAN SECTION  09/1984    Family History  Problem Relation Age of Onset   Other Mother        covid 59   Kidney failure Mother    Stroke Mother    Diabetes Mellitus II Mother    Heart attack Brother    Diabetes Mellitus II Brother    Social History   Socioeconomic History   Marital status: Single    Spouse name: Not on file   Number of children: 1   Years of education: Not on file   Highest education level: Not on file  Occupational History   Occupation: Instructor  Tobacco Use   Smoking status: Former  Current packs/day: 0.00    Average packs/day: 0.5 packs/day for 4.0 years (2.0 ttl pk-yrs)    Types: Cigarettes    Start date: 2020    Quit date: 03/17/2022    Years since quitting: 1.2   Smokeless tobacco: Never  Vaping Use   Vaping status: Never Used  Substance and Sexual Activity   Alcohol use: Yes    Alcohol/week: 1.0 standard drink of alcohol    Types: 1 Glasses of wine per week    Comment: rarely   Drug use: Never   Sexual activity: Not Currently  Other Topics Concern   Not on file  Social History Narrative   Not on file   Social Drivers of Health   Financial Resource Strain: Low Risk  (08/19/2022)   Overall Financial Resource Strain (CARDIA)    Difficulty of Paying Living Expenses:  Not hard at all  Food Insecurity: No Food Insecurity (08/19/2022)   Hunger Vital Sign    Worried About Running Out of Food in the Last Year: Never true    Ran Out of Food in the Last Year: Never true  Transportation Needs: No Transportation Needs (08/19/2022)   PRAPARE - Administrator, Civil Service (Medical): No    Lack of Transportation (Non-Medical): No  Physical Activity: Insufficiently Active (08/19/2022)   Exercise Vital Sign    Days of Exercise per Week: 3 days    Minutes of Exercise per Session: 30 min  Stress: No Stress Concern Present (08/19/2022)   [employer] of Occupational Health - Occupational Stress Questionnaire    Feeling of Stress : Not at all  Social Connections: Moderately Isolated (08/19/2022)   Social Connection and Isolation Panel [NHANES]    Frequency of Communication with Friends and Family: More than three times a week    Frequency of Social Gatherings with Friends and Family: Three times a week    Attends Religious Services: More than 4 times per year    Active Member of Clubs or Organizations: No    Attends Banker Meetings: Never    Marital Status: Never married   CONSTITUTIONAL: Negative for chills, fatigue, fever, unintentional weight gain and unintentional weight loss.  E/N/T: Negative for ear pain, nasal congestion and sore throat.  CARDIOVASCULAR: Negative for chest pain, dizziness, palpitations and pedal edema.  RESPIRATORY: Negative for recent cough and dyspnea.  GASTROINTESTINAL: Negative for abdominal pain, acid reflux symptoms, constipation, diarrhea, nausea and vomiting.  MSK: Negative for arthralgias and myalgias.  INTEGUMENTARY: Negative for rash.  NEUROLOGICAL: Negative for dizziness and headaches.  PSYCHIATRIC: Negative for sleep disturbance and to question depression screen.  Negative for depression, negative for anhedonia.       Objective:  PHYSICAL EXAM:   VS: BP 126/70   Pulse 94   Temp 98.1 F (36.7 C)  (Temporal)   Resp 18   Ht 5\' 5"  (1.651 m)   Wt 173 lb 9.6 oz (78.7 kg)   LMP  (LMP Unknown)   SpO2 94%   BMI 28.89 kg/m   GEN: Well nourished, well developed, in no acute distress   Cardiac: RRR; no murmurs, rubs, or gallops,no edema - Respiratory:  normal respiratory rate and pattern with no distress - normal breath sounds with no rales, rhonchi, wheezes or rubs  MS: no deformity or atrophy  Skin: warm and dry, no rash  Neuro:  Alert and Oriented x 3, - CN II-Xii grossly intact Psych: euthymic mood, appropriate affect and demeanor   Lab  Results  Component Value Date   WBC 8.4 03/02/2023   HGB 11.1 03/02/2023   HCT 33.9 (L) 03/02/2023   PLT 221 03/02/2023   GLUCOSE 245 (H) 03/02/2023   CHOL 148 01/29/2023   TRIG 52 01/29/2023   HDL 72 01/29/2023   LDLCALC 65 01/29/2023   ALT 20 03/02/2023   AST 20 03/02/2023   NA 135 03/02/2023   K 5.0 03/02/2023   CL 98 03/02/2023   CREATININE 0.86 03/02/2023   BUN 17 03/02/2023   CO2 21 03/02/2023   TSH 1.920 08/19/2022   HGBA1C 10.7 (H) 01/29/2023   MICROALBUR 80 11/16/2019      Assessment & Plan:   Problem List Items Addressed This Visit       Other   Hyperlipidemia associated with diabetes (HCC)   Relevant Orders   CBC with Differential/Platelet   Comprehensive metabolic panel   Lipid panel Conitnue crestor Low fat diet            Other Visit Diagnoses     Type 2 diabetes mellitus with hyperglycemia, without long-term current use of insulin (HCC)    -  Primary   Relevant Orders   CBC with Differential/Platelet   Comprehensive metabolic panel   TSH   Hemoglobin A1c Watch diet and continue meds   Vitamin D insufficiency       Relevant Orders   VITAMIN D 25 Hydroxy (Vit-D Deficiency, Fractures)   Anxiety     Continue celexa      Positive cologuard Refer to GI                                       .  No orders of the defined types were placed in this encounter.   Orders Placed This  Encounter  Procedures   CBC with Differential/Platelet   Comprehensive metabolic panel with GFR   TSH   Lipid panel   Hemoglobin A1c   VITAMIN D 25 Hydroxy (Vit-D Deficiency, Fractures)   Ambulatory referral to Gastroenterology     Follow-up: Return in about 4 months (around 10/15/2023) for chronic fasting follow-up.  An After Visit Summary was printed and given to the patient.  Jettie Pagan [facility] [phone]

## 2023-06-16 ENCOUNTER — Other Ambulatory Visit: Payer: Self-pay | Admitting: Physician Assistant

## 2023-06-16 DIAGNOSIS — E1165 Type 2 diabetes mellitus with hyperglycemia: Secondary | ICD-10-CM

## 2023-06-16 LAB — COMPREHENSIVE METABOLIC PANEL WITH GFR
ALT: 19 IU/L (ref 0–32)
AST: 21 IU/L (ref 0–40)
Albumin: 4.3 g/dL (ref 3.8–4.9)
Alkaline Phosphatase: 63 IU/L (ref 44–121)
BUN/Creatinine Ratio: 18 (ref 9–23)
BUN: 16 mg/dL (ref 6–24)
Bilirubin Total: 0.3 mg/dL (ref 0.0–1.2)
CO2: 23 mmol/L (ref 20–29)
Calcium: 9.8 mg/dL (ref 8.7–10.2)
Chloride: 99 mmol/L (ref 96–106)
Creatinine, Ser: 0.87 mg/dL (ref 0.57–1.00)
Globulin, Total: 3 g/dL (ref 1.5–4.5)
Glucose: 181 mg/dL — ABNORMAL HIGH (ref 70–99)
Potassium: 4.6 mmol/L (ref 3.5–5.2)
Sodium: 137 mmol/L (ref 134–144)
Total Protein: 7.3 g/dL (ref 6.0–8.5)
eGFR: 78 mL/min/{1.73_m2} (ref >59)

## 2023-06-16 LAB — CBC WITH DIFFERENTIAL/PLATELET
Basophils Absolute: 0 10*3/uL (ref 0.0–0.2)
Basos: 0 %
EOS (ABSOLUTE): 0.1 10*3/uL (ref 0.0–0.4)
Eos: 1 %
Hematocrit: 33.8 % — ABNORMAL LOW (ref 34.0–46.6)
Hemoglobin: 11.3 g/dL (ref 11.1–15.9)
Immature Grans (Abs): 0 10*3/uL (ref 0.0–0.1)
Immature Granulocytes: 0 %
Lymphocytes Absolute: 2.9 10*3/uL (ref 0.7–3.1)
Lymphs: 40 %
MCH: 31.1 pg (ref 26.6–33.0)
MCHC: 33.4 g/dL (ref 31.5–35.7)
MCV: 93 fL (ref 79–97)
Monocytes Absolute: 0.7 10*3/uL (ref 0.1–0.9)
Monocytes: 9 %
Neutrophils Absolute: 3.5 10*3/uL (ref 1.4–7.0)
Neutrophils: 50 %
Platelets: 249 10*3/uL (ref 150–450)
RBC: 3.63 x10E6/uL — ABNORMAL LOW (ref 3.77–5.28)
RDW: 14.2 % (ref 11.7–15.4)
WBC: 7.2 10*3/uL (ref 3.4–10.8)

## 2023-06-16 LAB — HEMOGLOBIN A1C
Est. average glucose Bld gHb Est-mCnc: 255 mg/dL
Hgb A1c MFr Bld: 10.5 % — ABNORMAL HIGH (ref 4.8–5.6)

## 2023-06-16 LAB — LIPID PANEL
Chol/HDL Ratio: 2.2 ratio (ref 0.0–4.4)
Cholesterol, Total: 150 mg/dL (ref 100–199)
HDL: 68 mg/dL (ref >39)
LDL Chol Calc (NIH): 67 mg/dL (ref 0–99)
Triglycerides: 79 mg/dL (ref 0–149)
VLDL Cholesterol Cal: 15 mg/dL (ref 5–40)

## 2023-06-16 LAB — VITAMIN D 25 HYDROXY (VIT D DEFICIENCY, FRACTURES): Vit D, 25-Hydroxy: 86.2 ng/mL (ref 30.0–100.0)

## 2023-06-16 LAB — TSH: TSH: 0.988 u[IU]/mL (ref 0.450–4.500)

## 2023-06-16 MED ORDER — EMPAGLIFLOZIN 25 MG PO TABS: 25.0000 mg | ORAL_TABLET | Freq: Every day | ORAL | 3 refills | Status: DC

## 2023-07-17 NOTE — Progress Notes (Signed)
   07/17/2023  Patient ID: Katie White, female   DOB: 1965/10/06, 58 y.o.   MRN: [id number]  Received TNM referral regarding A1c not at goal. Reaching out to PCP prior to engaging with patient to confirm patient might benefit from a visit with me.   Rolando Cliche, PharmD, BCGP Clinical Pharmacist  [phone]

## 2023-07-30 ENCOUNTER — Other Ambulatory Visit: Payer: Self-pay | Admitting: Physician Assistant

## 2023-07-30 DIAGNOSIS — E119 Type 2 diabetes mellitus without complications: Secondary | ICD-10-CM

## 2023-07-31 ENCOUNTER — Other Ambulatory Visit: Payer: Self-pay | Admitting: Physician Assistant

## 2023-07-31 DIAGNOSIS — E1165 Type 2 diabetes mellitus with hyperglycemia: Secondary | ICD-10-CM

## 2023-09-04 ENCOUNTER — Other Ambulatory Visit: Payer: Self-pay | Admitting: Physician Assistant

## 2023-09-04 DIAGNOSIS — E1165 Type 2 diabetes mellitus with hyperglycemia: Secondary | ICD-10-CM

## 2023-09-04 DIAGNOSIS — E559 Vitamin D deficiency, unspecified: Secondary | ICD-10-CM

## 2023-09-04 MED ORDER — EMPAGLIFLOZIN 25 MG PO TABS: 25.0000 mg | ORAL_TABLET | Freq: Every day | ORAL | 3 refills | Status: AC

## 2023-10-14 DIAGNOSIS — E119 Type 2 diabetes mellitus without complications: Secondary | ICD-10-CM | POA: Diagnosis not present

## 2023-10-14 LAB — HM DIABETES EYE EXAM

## 2023-10-19 ENCOUNTER — Encounter: Payer: Self-pay | Admitting: Physician Assistant

## 2023-10-19 ENCOUNTER — Ambulatory Visit: Admitting: Physician Assistant

## 2023-10-19 VITALS — BP 110/62 | HR 60 | Temp 97.9°F | Ht 65.0 in | Wt 174.2 lb

## 2023-10-19 DIAGNOSIS — Z1231 Encounter for screening mammogram for malignant neoplasm of breast: Secondary | ICD-10-CM | POA: Insufficient documentation

## 2023-10-19 DIAGNOSIS — E1169 Type 2 diabetes mellitus with other specified complication: Secondary | ICD-10-CM

## 2023-10-19 DIAGNOSIS — F419 Anxiety disorder, unspecified: Secondary | ICD-10-CM

## 2023-10-19 DIAGNOSIS — R195 Other fecal abnormalities: Secondary | ICD-10-CM

## 2023-10-19 DIAGNOSIS — E785 Hyperlipidemia, unspecified: Secondary | ICD-10-CM

## 2023-10-19 DIAGNOSIS — E559 Vitamin D deficiency, unspecified: Secondary | ICD-10-CM | POA: Diagnosis not present

## 2023-10-19 DIAGNOSIS — E1165 Type 2 diabetes mellitus with hyperglycemia: Secondary | ICD-10-CM | POA: Diagnosis not present

## 2023-10-19 MED ORDER — ROSUVASTATIN CALCIUM 5 MG PO TABS: 5.0000 mg | ORAL_TABLET | Freq: Every day | ORAL | 1 refills | Status: AC

## 2023-10-19 MED ORDER — PIOGLITAZONE HCL 45 MG PO TABS: 45.0000 mg | ORAL_TABLET | Freq: Every day | ORAL | 1 refills | Status: AC

## 2023-10-19 MED ORDER — LISINOPRIL 2.5 MG PO TABS: 2.5000 mg | ORAL_TABLET | Freq: Every day | ORAL | 1 refills | Status: AC

## 2023-10-19 NOTE — Progress Notes (Signed)
 Subjective:  Patient ID: Katie White, female    DOB: February 20, 1966  Age: 58 y.o. MRN: [id number]  Chief Complaint  Patient presents with   Medical Management of Chronic Issues         Katie White has a history of type 2 Diabetes Mellitus for several years. Current treatment includes actos  45mg  , jardiance  25 mg qd and janumet  50/1000mg   She denies hypoglycemic episodes. She states blood glucose levels have remained elevated with readings 150s-220s.  She  is consuming a heart healthy diet and exercising regularly. She performs diabetic foot checks daily after showering. Pt states she had recent eye exam at [facility] - will call to get record  She is on ACE inhibitor and statin  Pt with history of anxiety - currently stable on celexa  10mg  qd  Mixed hyperlipidemia  Pt presents with hyperlipidemia.  Compliance with treatment has been good -The patient is compliant with medications, maintains a low cholesterol diet , follows up as directed , and maintains an exercise regimen . The patient denies experiencing any hypercholesterolemia related symptoms. Currently on crestor  5mg  qd  Pt with vit D def - on weekly supplement and due for labwork  Pt had positive cologuard and appt was made with GI however she missed that appt - she is advised to call GI office to reschedule  Lab Results  Component Value Date   HGBA1C 10.5 (H) 06/15/2023   HGBA1C 10.7 (H) 01/29/2023   HGBA1C 8.0 (H) 08/19/2022    Current Outpatient Medications on File Prior to Visit  Medication Sig Dispense Refill   aspirin EC 81 MG tablet Take 81 mg by mouth daily. Swallow whole.     citalopram  (CELEXA ) 10 MG tablet Take 1 tablet (10 mg total) by mouth daily. 90 tablet 1   empagliflozin  (JARDIANCE ) 25 MG TABS tablet Take 1 tablet (25 mg total) by mouth daily before breakfast. 30 tablet 3   JANUMET  50-1000 MG tablet TAKE 1 TABLET BY MOUTH TWICE A DAY WITH FOOD 60 tablet 2   Multiple Vitamin (MULTIVITAMIN) LIQD Take 5 mLs by  mouth daily.     Vitamin D , Ergocalciferol , (DRISDOL ) 1.25 MG (50000 UNIT) CAPS capsule TAKE 1 CAPSULE (50,000 UNITS TOTAL) BY MOUTH EVERY 7 (SEVEN) DAYS 5 capsule 5   No current facility-administered medications on file prior to visit.   Past Medical History:  Diagnosis Date   Mixed hyperlipidemia 11/16/2019   Type 2 diabetes mellitus without complications (HCC) 11/16/2019   Past Surgical History:  Procedure Laterality Date   CESAREAN SECTION  09/1984    Family History  Problem Relation Age of Onset   Other Mother        covid 46   Kidney failure Mother    Stroke Mother    Diabetes Mellitus II Mother    Heart attack Brother    Diabetes Mellitus II Brother    Social History   Socioeconomic History   Marital status: Single    Spouse name: Not on file   Number of children: 1   Years of education: Not on file   Highest education level: Not on file  Occupational History   Occupation: Secondary school teacher  Tobacco Use   Smoking status: Former    Current packs/day: 0.00    Average packs/day: 0.5 packs/day for 4.0 years (2.0 ttl pk-yrs)    Types: Cigarettes    Start date: 2020    Quit date: 03/17/2022    Years since quitting: 1.5   Smokeless tobacco: Never  Vaping Use   Vaping status: Never Used  Substance and Sexual Activity   Alcohol use: Yes    Alcohol/week: 1.0 standard drink of alcohol    Types: 1 Glasses of wine per week    Comment: rarely   Drug use: Never   Sexual activity: Not Currently  Other Topics Concern   Not on file  Social History Narrative   Not on file   Social Drivers of Health   Financial Resource Strain: Low Risk  (08/19/2022)   Overall Financial Resource Strain (CARDIA)    Difficulty of Paying Living Expenses: Not hard at all  Food Insecurity: No Food Insecurity (08/19/2022)   Hunger Vital Sign    Worried About Running Out of Food in the Last Year: Never true    Ran Out of Food in the Last Year: Never true  Transportation Needs: No Transportation Needs  (08/19/2022)   PRAPARE - Administrator, Civil Service (Medical): No    Lack of Transportation (Non-Medical): No  Physical Activity: Insufficiently Active (08/19/2022)   Exercise Vital Sign    Days of Exercise per Week: 3 days    Minutes of Exercise per Session: 30 min  Stress: No Stress Concern Present (08/19/2022)   [employer] of Occupational Health - Occupational Stress Questionnaire    Feeling of Stress : Not at all  Social Connections: Moderately Isolated (08/19/2022)   Social Connection and Isolation Panel    Frequency of Communication with Friends and Family: More than three times a week    Frequency of Social Gatherings with Friends and Family: Three times a week    Attends Religious Services: More than 4 times per year    Active Member of Clubs or Organizations: No    Attends Banker Meetings: Never    Marital Status: Never married   CONSTITUTIONAL: Negative for chills, fatigue, fever, unintentional weight gain and unintentional weight loss.  E/N/T: Negative for ear pain, nasal congestion and sore throat.  CARDIOVASCULAR: Negative for chest pain, dizziness, palpitations and pedal edema.  RESPIRATORY: Negative for recent cough and dyspnea.  GASTROINTESTINAL: Negative for abdominal pain, acid reflux symptoms, constipation, diarrhea, nausea and vomiting.  MSK: Negative for arthralgias and myalgias.  INTEGUMENTARY: Negative for rash.  NEUROLOGICAL: Negative for dizziness and headaches.  PSYCHIATRIC: Negative for sleep disturbance and to question depression screen.  Negative for depression, negative for anhedonia.       Objective:  PHYSICAL EXAM:   VS: BP 110/62   Pulse 60   Temp 97.9 F (36.6 C)   Ht 5' 5 (1.651 m)   Wt 174 lb 3.2 oz (79 kg)   SpO2 97%   BMI 28.99 kg/m   GEN: Well nourished, well developed, in no acute distress   Cardiac: RRR; no murmurs, rubs, or gallops,no edema -  Respiratory:  normal respiratory rate and pattern with no  distress - normal breath sounds with no rales, rhonchi, wheezes or rubs  MS: no deformity or atrophy  Skin: warm and dry, no rash  Neuro:  Alert and Oriented x 3, - CN II-Xii grossly intact Psych: euthymic mood, appropriate affect and demeanor   Lab Results  Component Value Date   WBC 7.2 06/15/2023   HGB 11.3 06/15/2023   HCT 33.8 (L) 06/15/2023   PLT 249 06/15/2023   GLUCOSE 181 (H) 06/15/2023   CHOL 150 06/15/2023   TRIG 79 06/15/2023   HDL 68 06/15/2023   LDLCALC 67 06/15/2023   ALT 19 06/15/2023  AST 21 06/15/2023   NA 137 06/15/2023   K 4.6 06/15/2023   CL 99 06/15/2023   CREATININE 0.87 06/15/2023   BUN 16 06/15/2023   CO2 23 06/15/2023   TSH 0.988 06/15/2023   HGBA1C 10.5 (H) 06/15/2023   MICROALBUR 80 11/16/2019      Assessment & Plan:   Problem List Items Addressed This Visit       Other   Hyperlipidemia associated with diabetes (HCC)   Relevant Orders   CBC with Differential/Platelet   Comprehensive metabolic panel   Lipid panel Conitnue crestor  Low fat diet            Other Visit Diagnoses     Type 2 diabetes mellitus with hyperglycemia, without long-term current use of insulin (HCC)    -  Primary   Relevant Orders   CBC with Differential/Platelet   Comprehensive metabolic panel   TSH   Hemoglobin A1c Watch diet and continue meds   Vitamin D  insufficiency       Relevant Orders   VITAMIN D  25 Hydroxy (Vit-D Deficiency, Fractures)   Anxiety     Continue celexa       Positive cologuard Pt to call GI to reschedule missed appt      Breast cancer screening Mammogram scheduled                                .  Meds ordered this encounter  Medications   rosuvastatin  (CRESTOR ) 5 MG tablet    Sig: Take 1 tablet (5 mg total) by mouth daily.    Dispense:  90 tablet    Refill:  1   lisinopril  (ZESTRIL ) 2.5 MG tablet    Sig: Take 1 tablet (2.5 mg total) by mouth daily.    Dispense:  90 tablet    Refill:  1   pioglitazone   (ACTOS ) 45 MG tablet    Sig: Take 1 tablet (45 mg total) by mouth daily.    Dispense:  90 tablet    Refill:  1    Orders Placed This Encounter  Procedures   MM 3D SCREENING MAMMOGRAM BILATERAL BREAST   CBC with Differential/Platelet   Comprehensive metabolic panel with GFR   TSH   Lipid panel   Hemoglobin A1c   VITAMIN D  25 Hydroxy (Vit-D Deficiency, Fractures)     Follow-up: Return in about 4 months (around 02/18/2024) for chronic fasting follow-up - next wed for labwork.  An After Visit Summary was printed and given to the patient.  CAMIE JONELLE NICHOLAUS DEVONNA [facility] [phone]

## 2023-10-26 ENCOUNTER — Encounter: Payer: Self-pay | Admitting: Physician Assistant

## 2023-10-28 ENCOUNTER — Other Ambulatory Visit

## 2023-11-06 ENCOUNTER — Other Ambulatory Visit: Payer: Self-pay | Admitting: Physician Assistant

## 2023-11-06 DIAGNOSIS — E119 Type 2 diabetes mellitus without complications: Secondary | ICD-10-CM

## 2023-11-13 ENCOUNTER — Ambulatory Visit
Admission: RE | Admit: 2023-11-13 | Discharge: 2023-11-13 | Disposition: A | Discharge status: Home / Self Care | Source: Ambulatory Visit | Attending: Physician Assistant | Admitting: Physician Assistant

## 2023-11-13 DIAGNOSIS — Z1231 Encounter for screening mammogram for malignant neoplasm of breast: Secondary | ICD-10-CM

## 2024-01-19 ENCOUNTER — Other Ambulatory Visit: Payer: Self-pay | Admitting: Family Medicine

## 2024-01-19 DIAGNOSIS — E119 Type 2 diabetes mellitus without complications: Secondary | ICD-10-CM

## 2024-01-25 ENCOUNTER — Telehealth: Payer: Self-pay

## 2024-01-25 NOTE — Telephone Encounter (Signed)
 Patient was identified as falling into the [place] Measure - Diabetes.   Patient was: Appointment already scheduled for:  02/22/24.

## 2024-02-03 ENCOUNTER — Other Ambulatory Visit: Payer: Self-pay | Admitting: Physician Assistant

## 2024-02-03 DIAGNOSIS — F419 Anxiety disorder, unspecified: Secondary | ICD-10-CM

## 2024-02-22 ENCOUNTER — Ambulatory Visit: Admitting: Physician Assistant

## 2024-02-23 ENCOUNTER — Ambulatory Visit: Admitting: Physician Assistant

## 2024-02-23 ENCOUNTER — Encounter: Payer: Self-pay | Admitting: Physician Assistant

## 2024-02-23 VITALS — BP 110/66 | HR 60 | Temp 98.0°F | Resp 18 | Ht 65.0 in | Wt 174.6 lb

## 2024-02-23 DIAGNOSIS — E1165 Type 2 diabetes mellitus with hyperglycemia: Secondary | ICD-10-CM

## 2024-02-23 DIAGNOSIS — E559 Vitamin D deficiency, unspecified: Secondary | ICD-10-CM

## 2024-02-23 DIAGNOSIS — E1169 Type 2 diabetes mellitus with other specified complication: Secondary | ICD-10-CM | POA: Diagnosis not present

## 2024-02-23 DIAGNOSIS — E785 Hyperlipidemia, unspecified: Secondary | ICD-10-CM

## 2024-02-23 DIAGNOSIS — F419 Anxiety disorder, unspecified: Secondary | ICD-10-CM | POA: Diagnosis not present

## 2024-02-23 DIAGNOSIS — R195 Other fecal abnormalities: Secondary | ICD-10-CM

## 2024-02-23 NOTE — Addendum Note (Signed)
 Addended by: NICHOLAUS CREDIT on: 02/23/2024 10:54 AM   Modules accepted: Orders

## 2024-02-23 NOTE — Progress Notes (Addendum)
 Subjective:  Patient ID: Katie White, female    DOB: May 05, 1965  Age: 58 y.o. MRN: [id number]  Chief Complaint  Patient presents with   Medical Management of Chronic Issues         Theresia Puello has a history of type 2 Diabetes Mellitus for several years. Current treatment includes actos  45mg  , jardiance  10mg  qd and janumet  50/1000mg   She denies hypoglycemic episodes. She states blood glucose levels have remained elevated She  is consuming a heart healthy diet and exercising regularly. She performs diabetic foot checks daily after showering. States fasting glucose ranges 180-220 -  She is up to date for eye exam-  She is on ACE inhibitor and statin  Pt with history of anxiety - currently stable on celexa  10mg  qd  Mixed hyperlipidemia  Pt presents with hyperlipidemia.  Compliance with treatment has been good -The patient is compliant with medications, maintains a low cholesterol diet , follows up as directed , and maintains an exercise regimen . The patient denies experiencing any hypercholesterolemia related symptoms. Currently on crestor  5mg  qd  Pt with vit D def - on weekly supplement  Pt was referred to GI for colonoscopy but never heard about appt - referral was made at last visit - will give her number to call and schedule Lab Results  Component Value Date   HGBA1C 10.5 (H) 06/15/2023   HGBA1C 10.7 (H) 01/29/2023   HGBA1C 8.0 (H) 08/19/2022    Current Outpatient Medications on File Prior to Visit  Medication Sig Dispense Refill   aspirin EC 81 MG tablet Take 81 mg by mouth daily. Swallow whole.     citalopram  (CELEXA ) 10 MG tablet TAKE 1 TABLET BY MOUTH EVERY DAY 90 tablet 0   empagliflozin  (JARDIANCE ) 25 MG TABS tablet Take 1 tablet (25 mg total) by mouth daily before breakfast. 30 tablet 3   JANUMET  50-1000 MG tablet TAKE 1 TABLET BY MOUTH TWICE A DAY WITH FOOD 60 tablet 2   lisinopril  (ZESTRIL ) 2.5 MG tablet Take 1 tablet (2.5 mg total) by mouth daily. 90 tablet 1    Multiple Vitamin (MULTIVITAMIN) LIQD Take 5 mLs by mouth daily.     pioglitazone  (ACTOS ) 45 MG tablet Take 1 tablet (45 mg total) by mouth daily. 90 tablet 1   rosuvastatin  (CRESTOR ) 5 MG tablet Take 1 tablet (5 mg total) by mouth daily. 90 tablet 1   Vitamin D , Ergocalciferol , (DRISDOL ) 1.25 MG (50000 UNIT) CAPS capsule TAKE 1 CAPSULE (50,000 UNITS TOTAL) BY MOUTH EVERY 7 (SEVEN) DAYS 5 capsule 5   No current facility-administered medications on file prior to visit.   Past Medical History:  Diagnosis Date   Mixed hyperlipidemia 11/16/2019   Type 2 diabetes mellitus without complications (HCC) 11/16/2019   Past Surgical History:  Procedure Laterality Date   CESAREAN SECTION  09/1984    Family History  Problem Relation Age of Onset   Other Mother        covid 50   Kidney failure Mother    Stroke Mother    Diabetes Mellitus II Mother    Heart attack Brother    Diabetes Mellitus II Brother    Social History   Socioeconomic History   Marital status: Single    Spouse name: Not on file   Number of children: 1   Years of education: Not on file   Highest education level: Not on file  Occupational History   Occupation: Instructor  Tobacco Use   Smoking status: Former  Current packs/day: 0.00    Average packs/day: 0.5 packs/day for 4.0 years (2.0 ttl pk-yrs)    Types: Cigarettes    Start date: 2020    Quit date: 03/17/2022    Years since quitting: 1.9   Smokeless tobacco: Never  Vaping Use   Vaping status: Never Used  Substance and Sexual Activity   Alcohol use: Yes    Alcohol/week: 1.0 standard drink of alcohol    Types: 1 Glasses of wine per week    Comment: rarely   Drug use: Never   Sexual activity: Not Currently  Other Topics Concern   Not on file  Social History Narrative   Not on file   Social Drivers of Health   Financial Resource Strain: Low Risk  (08/19/2022)   Overall Financial Resource Strain (CARDIA)    Difficulty of Paying Living Expenses: Not hard at  all  Food Insecurity: No Food Insecurity (08/19/2022)   Hunger Vital Sign    Worried About Running Out of Food in the Last Year: Never true    Ran Out of Food in the Last Year: Never true  Transportation Needs: No Transportation Needs (08/19/2022)   PRAPARE - Administrator, Civil Service (Medical): No    Lack of Transportation (Non-Medical): No  Physical Activity: Insufficiently Active (08/19/2022)   Exercise Vital Sign    Days of Exercise per Week: 3 days    Minutes of Exercise per Session: 30 min  Stress: No Stress Concern Present (08/19/2022)   [employer] of Occupational Health - Occupational Stress Questionnaire    Feeling of Stress : Not at all  Social Connections: Moderately Isolated (08/19/2022)   Social Connection and Isolation Panel    Frequency of Communication with Friends and Family: More than three times a week    Frequency of Social Gatherings with Friends and Family: Three times a week    Attends Religious Services: More than 4 times per year    Active Member of Clubs or Organizations: No    Attends Banker Meetings: Never    Marital Status: Never married   CONSTITUTIONAL: Negative for chills, fatigue, fever, unintentional weight gain and unintentional weight loss.  E/N/T: Negative for ear pain, nasal congestion and sore throat.  CARDIOVASCULAR: Negative for chest pain, dizziness, palpitations and pedal edema.  RESPIRATORY: Negative for recent cough and dyspnea.  GASTROINTESTINAL: Negative for abdominal pain, acid reflux symptoms, constipation, diarrhea, nausea and vomiting.  MSK: Negative for arthralgias and myalgias.  INTEGUMENTARY: Negative for rash.  NEUROLOGICAL: Negative for dizziness and headaches.  PSYCHIATRIC: Negative for sleep disturbance and to question depression screen.  Negative for depression, negative for anhedonia.        Objective:  PHYSICAL EXAM:   VS: BP 110/66   Pulse 60   Temp 98 F (36.7 C) (Temporal)   Resp 18    Ht 5' 5 (1.651 m)   Wt 174 lb 9.6 oz (79.2 kg)   SpO2 98%   BMI 29.05 kg/m   GEN: Well nourished, well developed, in no acute distress  Cardiac: RRR; no murmurs, rubs, or gallops,no edema -  Respiratory:  normal respiratory rate and pattern with no distress - normal breath sounds with no rales, rhonchi, wheezes or rubs MS: no deformity or atrophy  Skin: warm and dry, no rash  Neuro:  Alert and Oriented x 3, - CN II-Xii grossly intact Psych: euthymic mood, appropriate affect and demeanor   Lab Results  Component Value Date  WBC 7.2 06/15/2023   HGB 11.3 06/15/2023   HCT 33.8 (L) 06/15/2023   PLT 249 06/15/2023   GLUCOSE 181 (H) 06/15/2023   CHOL 150 06/15/2023   TRIG 79 06/15/2023   HDL 68 06/15/2023   LDLCALC 67 06/15/2023   ALT 19 06/15/2023   AST 21 06/15/2023   NA 137 06/15/2023   K 4.6 06/15/2023   CL 99 06/15/2023   CREATININE 0.87 06/15/2023   BUN 16 06/15/2023   CO2 23 06/15/2023   TSH 0.988 06/15/2023   HGBA1C 10.5 (H) 06/15/2023   MICROALBUR 80 11/16/2019      Assessment & Plan:   Problem List Items Addressed This Visit       Other   Hyperlipidemia associated with diabetes (HCC)   Relevant Orders   CBC with Differential/Platelet   Comprehensive metabolic panel   Lipid panel Conitnue crestor  Low fat diet            Other Visit Diagnoses     Type 2 diabetes mellitus with hyperglycemia, without long-term current use of insulin (HCC)    -  Primary   Relevant Orders   CBC with Differential/Platelet   Comprehensive metabolic panel   TSH   Hemoglobin A1c Watch diet and continue meds Will consider switching to GLP1 versus insulin pending lab results   Vitamin D  insufficiency       Relevant Orders   VITAMIN D  25 Hydroxy (Vit-D Deficiency, Fractures)   Anxiety     Continue celexa       Positive cologuard Pt to call GI to set up appt                                      .  No orders of the defined types were placed in this  encounter.   Orders Placed This Encounter  Procedures   CBC with Differential/Platelet   Comprehensive metabolic panel with GFR   TSH   Lipid panel   Hemoglobin A1c   VITAMIN D  25 Hydroxy (Vit-D Deficiency, Fractures)   Microalbumin/Creatinine Ratio, Urine     Follow-up: Return in about 4 months (around 06/23/2024) for chronic fasting follow-up.  An After Visit Summary was printed and given to the patient.  CAMIE JONELLE NICHOLAUS DEVONNA [facility] [phone]

## 2024-02-24 ENCOUNTER — Ambulatory Visit: Payer: Self-pay | Admitting: Physician Assistant

## 2024-02-24 LAB — COMPREHENSIVE METABOLIC PANEL WITH GFR
ALT: 15 IU/L (ref 0–32)
AST: 18 IU/L (ref 0–40)
Albumin: 4.5 g/dL (ref 3.8–4.9)
Alkaline Phosphatase: 56 IU/L (ref 49–135)
BUN/Creatinine Ratio: 15 (ref 9–23)
BUN: 12 mg/dL (ref 6–24)
Bilirubin Total: 0.3 mg/dL (ref 0.0–1.2)
CO2: 21 mmol/L (ref 20–29)
Calcium: 9.8 mg/dL (ref 8.7–10.2)
Chloride: 100 mmol/L (ref 96–106)
Creatinine, Ser: 0.82 mg/dL (ref 0.57–1.00)
Globulin, Total: 2.9 g/dL (ref 1.5–4.5)
Glucose: 155 mg/dL — ABNORMAL HIGH (ref 70–99)
Potassium: 4.6 mmol/L (ref 3.5–5.2)
Sodium: 135 mmol/L (ref 134–144)
Total Protein: 7.4 g/dL (ref 6.0–8.5)
eGFR: 84 mL/min/1.73 (ref >59)

## 2024-02-24 LAB — CBC WITH DIFFERENTIAL/PLATELET
Basophils Absolute: 0 x10E3/uL (ref 0.0–0.2)
Basos: 0 %
EOS (ABSOLUTE): 0.1 x10E3/uL (ref 0.0–0.4)
Eos: 1 %
Hematocrit: 36 % (ref 34.0–46.6)
Hemoglobin: 11.8 g/dL (ref 11.1–15.9)
Immature Grans (Abs): 0 x10E3/uL (ref 0.0–0.1)
Immature Granulocytes: 0 %
Lymphocytes Absolute: 2.7 x10E3/uL (ref 0.7–3.1)
Lymphs: 41 %
MCH: 31.6 pg (ref 26.6–33.0)
MCHC: 32.8 g/dL (ref 31.5–35.7)
MCV: 96 fL (ref 79–97)
Monocytes Absolute: 0.6 x10E3/uL (ref 0.1–0.9)
Monocytes: 9 %
Neutrophils Absolute: 3.2 x10E3/uL (ref 1.4–7.0)
Neutrophils: 49 %
Platelets: 225 x10E3/uL (ref 150–450)
RBC: 3.74 x10E6/uL — ABNORMAL LOW (ref 3.77–5.28)
RDW: 14.3 % (ref 11.7–15.4)
WBC: 6.6 x10E3/uL (ref 3.4–10.8)

## 2024-02-24 LAB — HEMOGLOBIN A1C
Est. average glucose Bld gHb Est-mCnc: 237 mg/dL
Hgb A1c MFr Bld: 9.9 % — ABNORMAL HIGH (ref 4.8–5.6)

## 2024-02-24 LAB — LIPID PANEL
Chol/HDL Ratio: 2.1 ratio (ref 0.0–4.4)
Cholesterol, Total: 152 mg/dL (ref 100–199)
HDL: 72 mg/dL (ref >39)
LDL Chol Calc (NIH): 67 mg/dL (ref 0–99)
Triglycerides: 62 mg/dL (ref 0–149)
VLDL Cholesterol Cal: 13 mg/dL (ref 5–40)

## 2024-02-24 LAB — TSH: TSH: 1.14 u[IU]/mL (ref 0.450–4.500)

## 2024-02-24 LAB — MICROALBUMIN / CREATININE URINE RATIO
Creatinine, Urine: 42.8 mg/dL
Microalb/Creat Ratio: <7 mg/g{creat} (ref 0–29)
Microalbumin, Urine: <3 ug/mL

## 2024-02-24 LAB — VITAMIN D 25 HYDROXY (VIT D DEFICIENCY, FRACTURES): Vit D, 25-Hydroxy: 91.5 ng/mL (ref 30.0–100.0)

## 2024-02-25 ENCOUNTER — Other Ambulatory Visit: Payer: Self-pay | Admitting: Physician Assistant

## 2024-02-25 DIAGNOSIS — E1165 Type 2 diabetes mellitus with hyperglycemia: Secondary | ICD-10-CM

## 2024-02-25 MED ORDER — METFORMIN HCL 1000 MG PO TABS: 1000.0000 mg | ORAL_TABLET | Freq: Two times a day (BID) | ORAL | 1 refills | Status: AC

## 2024-04-14 ENCOUNTER — Other Ambulatory Visit: Payer: Self-pay | Admitting: Physician Assistant

## 2024-04-14 DIAGNOSIS — E559 Vitamin D deficiency, unspecified: Secondary | ICD-10-CM

## 2024-06-24 ENCOUNTER — Ambulatory Visit: Admitting: Physician Assistant
